# Patient Record
Sex: Female | Born: 1980
Health system: Southern US, Community
[De-identification: ages and names within clinical notes are randomized; demographics above are authoritative.]

## PROBLEM LIST (undated history)

## (undated) DIAGNOSIS — K760 Fatty (change of) liver, not elsewhere classified: Secondary | ICD-10-CM

## (undated) DIAGNOSIS — K219 Gastro-esophageal reflux disease without esophagitis: Secondary | ICD-10-CM

## (undated) DIAGNOSIS — E041 Nontoxic single thyroid nodule: Secondary | ICD-10-CM

## (undated) DIAGNOSIS — E0590 Thyrotoxicosis, unspecified without thyrotoxic crisis or storm: Secondary | ICD-10-CM

## (undated) DIAGNOSIS — I10 Essential (primary) hypertension: Secondary | ICD-10-CM

## (undated) DIAGNOSIS — R7309 Other abnormal glucose: Secondary | ICD-10-CM

## (undated) DIAGNOSIS — E119 Type 2 diabetes mellitus without complications: Secondary | ICD-10-CM

## (undated) DIAGNOSIS — D259 Leiomyoma of uterus, unspecified: Secondary | ICD-10-CM

## (undated) DIAGNOSIS — R945 Abnormal results of liver function studies: Secondary | ICD-10-CM

## (undated) DIAGNOSIS — N939 Abnormal uterine and vaginal bleeding, unspecified: Secondary | ICD-10-CM

## (undated) DIAGNOSIS — D649 Anemia, unspecified: Secondary | ICD-10-CM

## (undated) DIAGNOSIS — E210 Primary hyperparathyroidism: Secondary | ICD-10-CM

## (undated) HISTORY — DX: Essential (primary) hypertension: I10

## (undated) HISTORY — PX: NO PAST SURGERIES: SHX2092

## (undated) HISTORY — DX: Type 2 diabetes mellitus without complications: E11.9

## (undated) HISTORY — DX: Abnormal results of liver function studies: R94.5

## (undated) HISTORY — DX: Abnormal uterine and vaginal bleeding, unspecified: N93.9

## (undated) HISTORY — DX: Anemia, unspecified: D64.9

## (undated) HISTORY — DX: Other abnormal glucose: R73.09

## (undated) HISTORY — PX: ABDOMINAL HYSTERECTOMY: SHX81

---

## 1998-07-04 ENCOUNTER — Ambulatory Visit (HOSPITAL_COMMUNITY): Admission: RE | Admit: 1998-07-04 | Discharge: 1998-07-04 | Payer: Self-pay | Admitting: Internal Medicine

## 2001-05-17 ENCOUNTER — Other Ambulatory Visit: Admission: RE | Admit: 2001-05-17 | Discharge: 2001-05-17 | Payer: Self-pay | Admitting: Obstetrics and Gynecology

## 2001-11-01 ENCOUNTER — Ambulatory Visit (HOSPITAL_COMMUNITY): Admission: RE | Admit: 2001-11-01 | Discharge: 2001-11-01 | Payer: Self-pay | Admitting: Obstetrics and Gynecology

## 2001-11-09 ENCOUNTER — Encounter: Admission: RE | Admit: 2001-11-09 | Discharge: 2001-11-09 | Payer: Self-pay | Admitting: Obstetrics & Gynecology

## 2002-01-06 ENCOUNTER — Ambulatory Visit (HOSPITAL_COMMUNITY): Admission: RE | Admit: 2002-01-06 | Discharge: 2002-01-06 | Payer: Self-pay | Admitting: Obstetrics and Gynecology

## 2002-01-06 ENCOUNTER — Encounter: Payer: Self-pay | Admitting: Obstetrics and Gynecology

## 2002-01-09 ENCOUNTER — Inpatient Hospital Stay (HOSPITAL_COMMUNITY): Admission: AD | Admit: 2002-01-09 | Discharge: 2002-01-12 | Payer: Self-pay | Admitting: Obstetrics and Gynecology

## 2003-09-15 ENCOUNTER — Other Ambulatory Visit: Admission: RE | Admit: 2003-09-15 | Discharge: 2003-09-15 | Payer: Self-pay | Admitting: Obstetrics and Gynecology

## 2004-04-02 ENCOUNTER — Other Ambulatory Visit: Admission: RE | Admit: 2004-04-02 | Discharge: 2004-04-02 | Payer: Self-pay | Admitting: Obstetrics and Gynecology

## 2004-11-07 ENCOUNTER — Other Ambulatory Visit: Admission: RE | Admit: 2004-11-07 | Discharge: 2004-11-07 | Payer: Self-pay | Admitting: Obstetrics and Gynecology

## 2005-02-22 ENCOUNTER — Emergency Department (HOSPITAL_COMMUNITY): Admission: EM | Admit: 2005-02-22 | Discharge: 2005-02-22 | Payer: Self-pay | Admitting: Family Medicine

## 2005-11-26 ENCOUNTER — Other Ambulatory Visit: Admission: RE | Admit: 2005-11-26 | Discharge: 2005-11-26 | Payer: Self-pay | Admitting: Obstetrics and Gynecology

## 2006-11-03 ENCOUNTER — Emergency Department (HOSPITAL_COMMUNITY): Admission: EM | Admit: 2006-11-03 | Discharge: 2006-11-03 | Payer: Self-pay | Admitting: Family Medicine

## 2007-11-24 ENCOUNTER — Emergency Department (HOSPITAL_COMMUNITY): Admission: EM | Admit: 2007-11-24 | Discharge: 2007-11-24 | Payer: Self-pay | Admitting: Emergency Medicine

## 2011-06-30 LAB — POCT RAPID STREP A: Streptococcus, Group A Screen (Direct): POSITIVE — AB

## 2015-12-28 MED FILL — LOSARTAN POTASSIUM 50 MG TA: 50 | 90 days supply | Qty: 45 | Fill #0

## 2015-12-28 MED FILL — AMLODIPINE BESYLATE 10 MG T: 10 | 90 days supply | Qty: 90 | Fill #0

## 2016-05-02 MED FILL — LOSARTAN POTASSIUM 50 MG TA: 50 | 30 days supply | Qty: 15 | Fill #0

## 2016-05-02 MED FILL — AMLODIPINE BESYLATE 10 MG T: 10 | 90 days supply | Qty: 90 | Fill #1

## 2016-05-28 ENCOUNTER — Ambulatory Visit (INDEPENDENT_AMBULATORY_CARE_PROVIDER_SITE_OTHER): Payer: 59 | Admitting: Obstetrics and Gynecology

## 2016-05-28 ENCOUNTER — Encounter: Payer: Self-pay | Admitting: Obstetrics and Gynecology

## 2016-05-28 ENCOUNTER — Other Ambulatory Visit: Payer: Self-pay | Admitting: Obstetrics and Gynecology

## 2016-05-28 VITALS — BP 130/78 | HR 90 | Resp 16 | Ht 65.5 in | Wt 206.6 lb

## 2016-05-28 DIAGNOSIS — D259 Leiomyoma of uterus, unspecified: Secondary | ICD-10-CM

## 2016-05-28 DIAGNOSIS — R5383 Other fatigue: Secondary | ICD-10-CM

## 2016-05-28 DIAGNOSIS — R7989 Other specified abnormal findings of blood chemistry: Secondary | ICD-10-CM

## 2016-05-28 HISTORY — DX: Other specified abnormal findings of blood chemistry: R79.89

## 2016-05-28 LAB — CBC
HCT: 31.6 % — ABNORMAL LOW (ref 35.0–45.0)
Hemoglobin: 9.7 g/dL — ABNORMAL LOW (ref 11.7–15.5)
MCH: 24.5 pg — ABNORMAL LOW (ref 27.0–33.0)
MCHC: 30.7 g/dL — ABNORMAL LOW (ref 32.0–36.0)
MCV: 79.8 fL — ABNORMAL LOW (ref 80.0–100.0)
MPV: 9.8 fL (ref 7.5–12.5)
Platelets: 462 10*3/uL — ABNORMAL HIGH (ref 140–400)
RBC: 3.96 MIL/uL (ref 3.80–5.10)
RDW: 16.5 % — ABNORMAL HIGH (ref 11.0–15.0)
WBC: 10.2 10*3/uL (ref 3.8–10.8)

## 2016-05-28 LAB — COMPREHENSIVE METABOLIC PANEL
ALT: 53 U/L — ABNORMAL HIGH (ref 6–29)
AST: 33 U/L — ABNORMAL HIGH (ref 10–30)
Albumin: 4.4 g/dL (ref 3.6–5.1)
Alkaline Phosphatase: 67 U/L (ref 33–115)
BUN: 9 mg/dL (ref 7–25)
CO2: 24 mmol/L (ref 20–31)
Calcium: 10.6 mg/dL — ABNORMAL HIGH (ref 8.6–10.2)
Chloride: 103 mmol/L (ref 98–110)
Creat: 0.77 mg/dL (ref 0.50–1.10)
Glucose, Bld: 138 mg/dL — ABNORMAL HIGH (ref 65–99)
Potassium: 3.8 mmol/L (ref 3.5–5.3)
Sodium: 136 mmol/L (ref 135–146)
Total Bilirubin: 0.3 mg/dL (ref 0.2–1.2)
Total Protein: 8 g/dL (ref 6.1–8.1)

## 2016-05-28 LAB — TSH: TSH: 1.04 mIU/L

## 2016-05-28 NOTE — Progress Notes (Signed)
GYNECOLOGY  VISIT   HPI: 35 y.o.   Single  African American  female   [id number] with Patient's last menstrual period was 05/18/2016 (exact date).   here for second opinion regarding fibroids of uterus.   Diagnosed with fibroids one year ago. Symptoms are: heavy bleeding and using overnight pads.  Goes through a pack and a half of pads during the first couple of days of menstruation.  Unable to leave the house due to bleeding.  Lots of cramping.  Voiding often.  Feels heaviness and abdomen protrusion.  No back pain.  Feels really bad during her cycle.  Evaluation at [facility] - Dr. Rogue Bussing. Pelvic ultrasound 08/13/15  multifibroid uterus with 7 fibroids.  Largest right lateral - 8.5 cm.  2 submucosal fibroids measuring 4.3 and 3.2 cm.  7 fibroids total - 2.9 - 7.4 cm.  EMS 21.7 mm.  Obscured by fibroids. Left ovary 2.5 cm simple cyst. Right ovary not seen.   EMB 08/13/15 - polypoid degenerating endometrium.   Treatment has included: Lupron was planned but not taken due to cost.  Taking iron for anemia.   Considering future pregnancies but not sure.  Thinks she probably would not have more children.  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   GYNECOLOGIC HISTORY: Patient's last menstrual period was 05/18/2016 (exact date). Contraception:  Condoms most of the time Menopausal hormone therapy:  n/a Last mammogram:  n/a Last pap smear:   05-15-14 Neg:Neg HR HPV        OB History    Gravida Para Term Preterm AB Living   1 1 1     1    SAB TAB Ectopic Multiple Live Births                     There are no active problems to display for this patient.   Past Medical History:  Diagnosis Date  . Abnormal uterine bleeding   . Anemia   . Fibroid   . Hypertension     No past surgical history on file.  Current Outpatient Prescriptions  Medication Sig Dispense Refill  . amLODipine (NORVASC) 10 MG tablet Take 1 tablet by mouth daily.  1  . losartan (COZAAR) 50 MG tablet  Take 50 mg by mouth daily. Patient takes 1/2 tablet daily  0   No current facility-administered medications for this visit.      ALLERGIES: Review of patient's allergies indicates not on file.  Family History  Problem Relation Age of Onset  . Stroke Mother   . Hypertension Mother   . Breast cancer Sister 45    A & W  . Diabetes Paternal Grandmother     Social History   Social History  . Marital status: Single    Spouse name: N/A  . Number of children: N/A  . Years of education: N/A   Occupational History  . Not on file.   Social History Main Topics  . Smoking status: Never Smoker  . Smokeless tobacco: Never Used  . Alcohol use 1.2 oz/week    2 Standard drinks or equivalent per week  . Drug use: No  . Sexual activity: Yes    Partners: Male    Birth control/ protection: Condom     Comment: condoms most of the time   Other Topics Concern  . Not on file   Social History Narrative  . No narrative on file    ROS:  Pertinent items are noted in HPI.  PHYSICAL EXAMINATION:    BP 130/78 (BP Location: Right Arm, Patient Position: Sitting, Cuff Size: Large)   Pulse 90   Resp 16   Ht 5' 5.5" (1.664 m)   Wt 206 lb 9.6 oz (93.7 kg)   LMP 05/18/2016 (Exact Date)   BMI 33.86 kg/m     General appearance: alert, cooperative and appears stated age Head: Normocephalic, without obvious abnormality, atraumatic Neck: no adenopathy, supple, symmetrical, trachea midline and thyroid normal to inspection and palpation Lungs: clear to auscultation bilaterally Heart: regular rate and rhythm Abdomen: 22 week size uterus. soft, non-tender, no masses,  no organomegaly Extremities: extremities normal, atraumatic, no cyanosis or edema Skin: Skin color, texture, turgor normal. No rashes or lesions Lymph nodes: Cervical, supraclavicular, and axillary nodes normal. No abnormal inguinal nodes palpated Neurologic: Grossly normal  Pelvic: External genitalia:  no lesions               Urethra:  normal appearing urethra with no masses, tenderness or lesions              Bartholins and Skenes: normal                 Vagina: normal appearing vagina with normal color and discharge, no lesions              Cervix: no lesions                Bimanual Exam:  Uterus:  22 week size.  Large right lateral fibroid.  Large LUS fibroid pushing into the vagina.  Cervix is very anterior.  Uterus fills pelvis from side to side.  Limited mobility.               Adnexa: no mass, fullness, tenderness          Chaperone was present for exam.  ASSESSMENT  Large uterine fibroids including submucous fibroids. Fatigue.   HTN.  PLAN  Fibroids discussed. Treatment options discussed including Depo Provera, Depo Lupron, uterine artery embolization, myomectomy, and hysterectomy.  Wishes to pursue Depo Lupron.  Will check financial assistance program for injection(s).  I would plan on minimum of 3 months and a maximum of 6 months.  Side effects and add back therapy discussed.  Check CBC, CMP, TSH today.  ACOG handout on fibroids.  Will need a recheck appointment at the end of Depo Lupron treatment.   _____45__ minutes face to face time of which over 50% was spent in counseling.      An After Visit Summary was printed and given to the patient.

## 2016-05-30 ENCOUNTER — Other Ambulatory Visit: Payer: Self-pay | Admitting: Obstetrics and Gynecology

## 2016-05-30 ENCOUNTER — Encounter: Payer: Self-pay | Admitting: Obstetrics and Gynecology

## 2016-05-30 DIAGNOSIS — R945 Abnormal results of liver function studies: Secondary | ICD-10-CM

## 2016-05-30 DIAGNOSIS — R7989 Other specified abnormal findings of blood chemistry: Secondary | ICD-10-CM

## 2016-05-30 DIAGNOSIS — D649 Anemia, unspecified: Secondary | ICD-10-CM

## 2016-05-30 LAB — HEMOGLOBIN A1C
Hgb A1c MFr Bld: 5.7 % — ABNORMAL HIGH (ref <5.7)
Mean Plasma Glucose: 117 mg/dL

## 2016-06-05 ENCOUNTER — Telehealth: Payer: Self-pay | Admitting: *Deleted

## 2016-06-05 MED FILL — LOSARTAN POTASSIUM 50 MG TA: 50 | 30 days supply | Qty: 15 | Fill #0

## 2016-06-05 NOTE — Telephone Encounter (Signed)
Call to patient to follow-up on Lupron. Left message to call back.

## 2016-06-06 ENCOUNTER — Telehealth: Payer: Self-pay | Admitting: *Deleted

## 2016-06-06 ENCOUNTER — Encounter: Payer: Self-pay | Admitting: Obstetrics and Gynecology

## 2016-06-06 NOTE — Telephone Encounter (Signed)
Call to patient. Advised I spoke to [employer] yesterday (see phone call attempt from 06-05-16) and patient qualifies for savings card to reduce this amount. [employer] tried to contact her but was unable to reach her. Phone number provided to patient for her to call. Discussed that once she receives savings card from Lupron and works out payment with speciality pharmacy, medication will be shipped here and given with her menstrual cycle.  Patient to call back with menses or if any difficulty getting savings card.  Routing to provider for final review. Patient agreeable to disposition. Will close encounter.    Routed to Dr Talbert Nan covering for Dr Quincy Simmonds.

## 2016-06-06 NOTE — Telephone Encounter (Signed)
My Chart message from patient:  Hi Dr.Silva. I received a phone call today from [employer] about the Lupron injection. When speaking to them they advised me that the copay for the injection would be $280!! I thought that when we spoke there was some type of savings or assistance that I could get for the prescription. If there is would you provide me with the names and phone numbers. The cost of that prescription is way out of my price range and I know I wouldn't be able to pay that upfront.               Thanks

## 2016-06-12 NOTE — Telephone Encounter (Signed)
See next encounter on 06-06-16.  Routing to provider for final review. Will close encounter.

## 2016-06-13 ENCOUNTER — Telehealth: Payer: Self-pay | Admitting: Obstetrics and Gynecology

## 2016-06-13 MED ORDER — LEUPROLIDE ACETATE 3.75 MG IM KIT: 3.7500 mg | PACK | INTRAMUSCULAR | 5 refills | Status: DC

## 2016-06-13 MED FILL — LUPRON DEPOT 3.75 MG KIT: 3.75 | 30 days supply | Qty: 1 | Fill #0

## 2016-06-13 NOTE — Telephone Encounter (Signed)
Per [facility] pharmacy. Can use the [facility] out-patient pharmacy location of choice. Will have to be ordered but should arrive Monday if ordered today. Patient notified, RX sent to [facility]. She will call them with savings card information. Menses is due Saturday so will aim to proceed with first injection next week.  Routing to provider for final review. Patient agreeable to disposition. Will close encounter.

## 2016-06-13 NOTE — Telephone Encounter (Signed)
Call to patient. She received co-pay card and was ready for medication to be sent through [facility]. Has now been told medication must come through [facility] since she is [employer]. Advised will call [facility] and call her back.

## 2016-06-13 NOTE — Telephone Encounter (Signed)
Patient is calling regarding her prescription. Patient was told by the insurance company that the prescription needs to be sent to [facility] pharmacy. Patient was told the prescription was to be delivered to our office. See previous telephone from [place].

## 2016-06-16 NOTE — Progress Notes (Signed)
Patient here for her first Lupron Injection.  UPT is negative  Patient tolerated well. Injection given in   RIght Glute.   Routed to provider and encounter closed.

## 2016-06-17 ENCOUNTER — Ambulatory Visit (INDEPENDENT_AMBULATORY_CARE_PROVIDER_SITE_OTHER): Payer: 59

## 2016-06-17 VITALS — BP 126/74 | HR 116 | Resp 16 | Ht 65.5 in | Wt 206.0 lb

## 2016-06-17 DIAGNOSIS — D259 Leiomyoma of uterus, unspecified: Secondary | ICD-10-CM | POA: Diagnosis not present

## 2016-06-17 MED ORDER — LEUPROLIDE ACETATE 3.75 MG IM KIT
3.7500 mg | PACK | Freq: Once | INTRAMUSCULAR | Status: AC
  Administered 2016-06-17: 3.75 mg via INTRAMUSCULAR

## 2016-06-26 ENCOUNTER — Encounter: Payer: Self-pay | Admitting: Obstetrics and Gynecology

## 2016-07-23 ENCOUNTER — Telehealth: Payer: Self-pay | Admitting: Obstetrics and Gynecology

## 2016-07-23 DIAGNOSIS — D259 Leiomyoma of uterus, unspecified: Secondary | ICD-10-CM | POA: Diagnosis not present

## 2016-07-23 DIAGNOSIS — Z0000 Encounter for general adult medical examination without abnormal findings: Secondary | ICD-10-CM | POA: Diagnosis not present

## 2016-07-23 DIAGNOSIS — Z862 Personal history of diseases of the blood and blood-forming organs and certain disorders involving the immune mechanism: Secondary | ICD-10-CM | POA: Diagnosis not present

## 2016-07-23 DIAGNOSIS — I10 Essential (primary) hypertension: Secondary | ICD-10-CM | POA: Diagnosis not present

## 2016-07-23 MED FILL — LOSARTAN POTASSIUM 50 MG TA: 50 | 90 days supply | Qty: 45 | Fill #0

## 2016-07-23 MED FILL — LUPRON DEPOT 3.75 MG KIT: 3.75 | 30 days supply | Qty: 1 | Fill #1

## 2016-07-23 NOTE — Telephone Encounter (Signed)
Spoke with patient. Patient calling to request scheduling for Lupron injection. Patient scheduled for 07/24/16 at 9:30am. Patient states she will pick up medication from pharmacy today. Patient is agreeable to date and time.    Routing to provider for final review. Patient is agreeable to disposition. Will close encounter.

## 2016-07-23 NOTE — Telephone Encounter (Signed)
Please see myChart note.  Also patient called and said she needs to schedule Lupron injection for Thursday or Friday.

## 2016-07-24 ENCOUNTER — Ambulatory Visit (INDEPENDENT_AMBULATORY_CARE_PROVIDER_SITE_OTHER): Payer: 59

## 2016-07-24 ENCOUNTER — Ambulatory Visit: Payer: Self-pay

## 2016-07-24 VITALS — BP 116/68 | HR 96 | Resp 18 | Wt 204.0 lb

## 2016-07-24 DIAGNOSIS — D259 Leiomyoma of uterus, unspecified: Secondary | ICD-10-CM

## 2016-07-24 MED ORDER — LEUPROLIDE ACETATE 3.75 MG IM KIT
3.7500 mg | PACK | Freq: Once | INTRAMUSCULAR | Status: AC
  Administered 2016-07-24: 3.75 mg via INTRAMUSCULAR

## 2016-07-24 NOTE — Progress Notes (Signed)
35 y.o. Single African American female here for her second Depo Lupron injection.  Indication for injection:  Uterine leiomyoma, unspecified location  LMP:  06/14/16  UPT: Negative  Lupron order:  3.75mg  IM in Left Glute.    Order to administer given by Dr. Quincy Simmonds  Injection was tolerated well.

## 2016-07-25 ENCOUNTER — Encounter: Payer: Self-pay | Admitting: Obstetrics and Gynecology

## 2016-07-30 ENCOUNTER — Encounter: Payer: Self-pay | Admitting: Obstetrics and Gynecology

## 2016-08-22 MED FILL — LUPRON DEPOT 3.75 MG KIT: 3.75 | 30 days supply | Qty: 1 | Fill #2

## 2016-08-25 ENCOUNTER — Telehealth: Payer: Self-pay | Admitting: Obstetrics and Gynecology

## 2016-08-25 NOTE — Telephone Encounter (Signed)
Patient states that she needs to schedule her Lupron injection for tomorrow.

## 2016-08-25 NOTE — Telephone Encounter (Signed)
Spoke with patient. Patient calling to schedule appt for Lupron inj. Last injection 07/24/16. Patient to pick up med from pharmacy today and bring for injection. Patient scheduled for 11/21 at 0930. Patient verbalizes understanding and is agreeable.  Routing to provider for final review. Patient is agreeable to disposition. Will close encounter.  Cc: Dr. Quincy Simmonds

## 2016-08-26 ENCOUNTER — Ambulatory Visit (INDEPENDENT_AMBULATORY_CARE_PROVIDER_SITE_OTHER): Payer: 59

## 2016-08-26 VITALS — BP 112/60 | HR 68 | Resp 16 | Ht 65.5 in | Wt 205.0 lb

## 2016-08-26 DIAGNOSIS — D259 Leiomyoma of uterus, unspecified: Secondary | ICD-10-CM | POA: Diagnosis not present

## 2016-08-26 MED ORDER — LEUPROLIDE ACETATE 3.75 MG IM KIT
3.7500 mg | PACK | Freq: Once | INTRAMUSCULAR | Status: AC
  Administered 2016-08-26: 3.75 mg via INTRAMUSCULAR

## 2016-08-26 NOTE — Progress Notes (Signed)
35 y.o. Single African American female here for her third Depo Lupron injection.  Indication for injection:  Uterine Leiomyoma  LMP:  06/14/16  UPT: Negative  Lupron order:  3.75mg mg IM in R Glute.    Order to administer given by Dr. Quincy Simmonds.  Injection was tolerated well.

## 2016-09-04 ENCOUNTER — Encounter: Payer: Self-pay | Admitting: Obstetrics and Gynecology

## 2016-09-04 ENCOUNTER — Telehealth: Payer: Self-pay

## 2016-09-04 NOTE — Telephone Encounter (Signed)
Telephone encounter created to review with Dr.Silva. 

## 2016-09-04 NOTE — Telephone Encounter (Signed)
Non-Urgent Medical Question  Message [id number]  From [place], MD Sent 09/04/2016 11:40 AM  Hi Dr.Silva,   I received my third Lupron injection on the 21st. During our conversation about the treatment you recommended that I take it for three months. I noticed the prescription is for 6 months. What's the next step after this injection wears off?                    Thanks   Responsible Party   Pool - [facility] Clinical Pool No one has taken responsibility for this message.  No actions have been taken on this message.   Routing to [place] for review and advise.

## 2016-09-05 ENCOUNTER — Telehealth: Payer: Self-pay | Admitting: Obstetrics and Gynecology

## 2016-09-05 DIAGNOSIS — D259 Leiomyoma of uterus, unspecified: Secondary | ICD-10-CM

## 2016-09-05 MED FILL — AMLODIPINE BESYLATE 10 MG T: 10 | 90 days supply | Qty: 90 | Fill #0

## 2016-09-05 NOTE — Telephone Encounter (Signed)
Spoke with patient. Patient returning call for PUS scheduling (see also, telephone encounter dated 09/04/16). Patient scheduled for 09/18/16 at 0800, 0830 appt to follow with Dr. Quincy Simmonds. Patient verbalizes understanding and is agreeable to date and time.  Dr. Quincy Simmonds, can you clarify PUS diagnosis? And I will place order.   Cc: Lerry Liner; [facility]

## 2016-09-05 NOTE — Telephone Encounter (Signed)
Ultrasound and examination sounds great! Please put in an order for pelvic ultrasound.

## 2016-09-05 NOTE — Telephone Encounter (Signed)
Reason for ultrasound is fibroids.

## 2016-09-05 NOTE — Telephone Encounter (Signed)
Spoke with patient. Advised of message as seen below from [place]. Patient is unable to schedule at this time as she does not have her calendar. Will return call to the office to schedule PUS appointment.  Routing to provider for final review. Patient agreeable to disposition. Will close encounter.

## 2016-09-05 NOTE — Telephone Encounter (Signed)
Patient is returning a call to [place]. Patient is ready to schedule her ultrasound.

## 2016-09-05 NOTE — Telephone Encounter (Signed)
Spoke with patient. Advised of message as seen below from [place]. Patient became frustrated stating "When Dr.Silva and I spoke before she mentioned coming in after 3 months for an ultrasound to see if it was working and if I needed to continue. Coming in to talk about what we have already talked about makes no sense."  Advised I can review with Dr.Silva and return call. Patient is agreeable.

## 2016-09-05 NOTE — Telephone Encounter (Signed)
I would like to have the patient return to the office before 12/21 to re-evaluate her clinical picture and create goals with her.  Her next Depo Lupron would be due on 12/21 if we are going to continue with this therapy for 6 months.

## 2016-09-08 NOTE — Telephone Encounter (Signed)
Order placed.  Routing to provider for final review. Patient is agreeable to disposition. Will close encounter.

## 2016-09-18 ENCOUNTER — Ambulatory Visit (INDEPENDENT_AMBULATORY_CARE_PROVIDER_SITE_OTHER): Payer: 59 | Admitting: Obstetrics and Gynecology

## 2016-09-18 ENCOUNTER — Encounter: Payer: Self-pay | Admitting: Obstetrics and Gynecology

## 2016-09-18 ENCOUNTER — Ambulatory Visit (INDEPENDENT_AMBULATORY_CARE_PROVIDER_SITE_OTHER): Payer: 59

## 2016-09-18 ENCOUNTER — Other Ambulatory Visit: Payer: Self-pay | Admitting: Obstetrics and Gynecology

## 2016-09-18 VITALS — BP 110/74 | HR 70 | Resp 16 | Ht 65.5 in | Wt 204.0 lb

## 2016-09-18 DIAGNOSIS — R7989 Other specified abnormal findings of blood chemistry: Secondary | ICD-10-CM

## 2016-09-18 DIAGNOSIS — R945 Abnormal results of liver function studies: Secondary | ICD-10-CM

## 2016-09-18 DIAGNOSIS — D649 Anemia, unspecified: Secondary | ICD-10-CM

## 2016-09-18 DIAGNOSIS — D259 Leiomyoma of uterus, unspecified: Secondary | ICD-10-CM

## 2016-09-18 LAB — HEPATIC FUNCTION PANEL
ALBUMIN: 4.8 g/dL (ref 3.6–5.1)
ALT: 116 U/L — AB (ref 6–29)
AST: 78 U/L — ABNORMAL HIGH (ref 10–30)
Alkaline Phosphatase: 85 U/L (ref 33–115)
BILIRUBIN INDIRECT: 0.7 mg/dL (ref 0.2–1.2)
Bilirubin, Direct: 0.1 mg/dL (ref <=0.2)
TOTAL PROTEIN: 8.8 g/dL — AB (ref 6.1–8.1)
Total Bilirubin: 0.8 mg/dL (ref 0.2–1.2)

## 2016-09-18 LAB — CBC
HEMATOCRIT: 43.7 % (ref 35.0–45.0)
Hemoglobin: 14.3 g/dL (ref 11.7–15.5)
MCH: 27.1 pg (ref 27.0–33.0)
MCHC: 32.7 g/dL (ref 32.0–36.0)
MCV: 82.9 fL (ref 80.0–100.0)
MPV: 9.9 fL (ref 7.5–12.5)
PLATELETS: 345 10*3/uL (ref 140–400)
RBC: 5.27 MIL/uL — ABNORMAL HIGH (ref 3.80–5.10)
RDW: 16.9 % — AB (ref 11.0–15.0)
WBC: 8.4 10*3/uL (ref 3.8–10.8)

## 2016-09-18 NOTE — Progress Notes (Signed)
GYNECOLOGY  VISIT   HPI: 35 y.o.   Single  African American  female   [id number] with No LMP recorded. Patient has had an injection.   here for PUS to recheck fibroids.  Seen 05/28/16 and had 22 week size uterus filling the pelvis.  Hgb 9.7 on 05/28/16. Had follow up Hgb with [facility] and it was normal.   Received Depo Lupron x 3.  Some hot flashes.  Declines future childbearing.  Considering hysterectomy.   On 05/28/16, had elevated hemoglobin A1C to 5.7, calcium level 10.6, and SGOT 33, SGPT 53, and no known follow up to date.  GYNECOLOGIC HISTORY: No LMP recorded. Patient has had an injection. Contraception:  Condoms most of the time, pt on lupron Menopausal hormone therapy:  None Last mammogram:  none Last pap smear:   05-15-14 neg        OB History    Gravida Para Term Preterm AB Living   1 1 1     1    SAB TAB Ectopic Multiple Live Births                     There are no active problems to display for this patient.   Past Medical History:  Diagnosis Date  . Abnormal uterine bleeding   . Anemia   . Elevated liver function tests   . Fibroid   . Hypertension   . Serum calcium elevated     No past surgical history on file.  Current Outpatient Prescriptions  Medication Sig Dispense Refill  . amLODipine (NORVASC) 10 MG tablet Take 1 tablet by mouth daily.  1  . leuprolide (LUPRON DEPOT, 20-MONTH,) 3.75 MG injection Inject 3.75 mg into the muscle every 30 (thirty) days. 1 each 5  . losartan (COZAAR) 50 MG tablet Take 50 mg by mouth daily. Patient takes 1/2 tablet daily  0   No current facility-administered medications for this visit.      ALLERGIES: Patient has no allergy information on record.  Family History  Problem Relation Age of Onset  . Stroke Mother   . Hypertension Mother   . Breast cancer Sister 30    A & W  . Diabetes Paternal Grandmother     Social History   Social History  . Marital status: Single    Spouse name: N/A  . Number of children:  N/A  . Years of education: N/A   Occupational History  . Not on file.   Social History Main Topics  . Smoking status: Never Smoker  . Smokeless tobacco: Never Used  . Alcohol use 1.2 oz/week    2 Standard drinks or equivalent per week  . Drug use: No  . Sexual activity: Yes    Partners: Male    Birth control/ protection: Condom     Comment: condoms most of the time   Other Topics Concern  . Not on file   Social History Narrative  . No narrative on file    ROS:  Pertinent items are noted in HPI.  PHYSICAL EXAMINATION:    BP 110/74   Pulse 70   Resp 16   Ht 5' 5.5" (1.664 m)   Wt 204 lb (92.5 kg)   BMI 33.43 kg/m     General appearance: alert, cooperative and appears stated age   Abdomen: 19 week size uterus, soft, non-tender.  Pelvic ultrasound: 8 fibroids - 2.2 cm - 6.1 cm. EMS obscured by central fibroids.  Normal ovaries.  No free  fluid.   ASSESSMENT   Fibroid uterus.  Status post Depo Lupron x 3.  Recent anemia.  Elevated calcium and LFTs.  HgbA1C 5.7.  PLAN  Discussed hysterectomy, uterine artery embolization, medical therapy for treatment of fibroids.  Patient will continue Depo Lupron x 3 more months.  Plan for laparoscopic hysterectomy/bilateral salpingectomy/cystoscopy in March.  Check CBC, CMP, PTH today.  ACOG handout on hysterectomy to patient.    An After Visit Summary was printed and given to the patient.  __25____ minutes face to face time of which over 50% was spent in counseling.

## 2016-09-19 LAB — PTH, INTACT AND CALCIUM
Calcium: 10.7 mg/dL — ABNORMAL HIGH (ref 8.6–10.2)
PTH: 108 pg/mL — AB (ref 14–64)

## 2016-09-21 ENCOUNTER — Encounter: Payer: Self-pay | Admitting: Obstetrics and Gynecology

## 2016-09-22 ENCOUNTER — Telehealth: Payer: Self-pay | Admitting: *Deleted

## 2016-09-22 DIAGNOSIS — E349 Endocrine disorder, unspecified: Secondary | ICD-10-CM

## 2016-09-22 NOTE — Telephone Encounter (Signed)
-----   Message from Nunzio Cobbs, MD sent at 09/21/2016  8:08 PM EST ----- Results to patient through My Chart. Please contact the patient to have a follow up appointment with endocrinology and her PCP.   Hello [name] [name],   I am sharing your blood work with you which shows an elevated calcium and parathyroid hormone level.   You may be developing hyperparathyroidism.   I would like for you to see an endocrinologist for further evaluation and treatment of this.   Your liver function studies increased further, and you will also need follow up of this as well by your primary care provider.   Your hemoglobin is now normal.   I will have the nurse contact you in follow up to this note, so we can help to coordinate your care.   Thanks,   [name] Half, MD

## 2016-09-22 NOTE — Telephone Encounter (Signed)
Left message to call Ainslie Mazurek at [phone].  

## 2016-09-24 ENCOUNTER — Encounter: Payer: Self-pay | Admitting: Obstetrics and Gynecology

## 2016-09-24 NOTE — Telephone Encounter (Signed)
Keep this phone message open and call patient back in one week if she has not returned the call.  I would like for [place] to know when she will be seen.

## 2016-09-24 NOTE — Telephone Encounter (Signed)
Patient returning call.

## 2016-09-24 NOTE — Telephone Encounter (Addendum)
Spoke with patient. Patient states she has reviewed MyChart message as seen below per Dr. Quincy Simmonds. Advised patient referral placed to [facility] Endocrinology, Dr. Loanne Drilling- will receive call from our referral department for scheduling. Advised, RN will call and schedule f/u appt with pcp, asked patient for availability for scheduling, patient declined. Patient states she would like to call and schedule that appointment. Advised patient to return call to office with appointment details. Patient verbalizes understanding and is agreeable.

## 2016-09-24 NOTE — Telephone Encounter (Addendum)
Left message to call Sharee Pimple at [phone].  Order placed for referral to [facility] Endocrinology -Dr. Renato Shin.  Cc: Theresia Lo

## 2016-09-30 MED FILL — LUPRON DEPOT 3.75 MG KIT: 3.75 | 30 days supply | Qty: 1 | Fill #3

## 2016-10-01 NOTE — Progress Notes (Signed)
35 y.o. Single African American female here for her fourth Depo Lupron injection.  Indication for injection:  Uterine Leiomyoma  LMP:  Not since starting Lupron Injection  UPT: Neg  Contraception:  Condoms  Lupron order:  3.75mg  IM in L Glute.  Patient tolerated well.   Order to administer given by Dr. Quincy Simmonds.

## 2016-10-01 NOTE — Telephone Encounter (Signed)
Left detailed message, ok per current dpr. Advised patient was returning call to f/u with pcp scheduling. Advised to return call to [place] at [phone].

## 2016-10-02 ENCOUNTER — Ambulatory Visit (INDEPENDENT_AMBULATORY_CARE_PROVIDER_SITE_OTHER): Payer: 59

## 2016-10-02 VITALS — BP 140/90 | HR 88 | Resp 20 | Ht 65.5 in | Wt 208.6 lb

## 2016-10-02 DIAGNOSIS — Z01812 Encounter for preprocedural laboratory examination: Secondary | ICD-10-CM | POA: Diagnosis not present

## 2016-10-02 DIAGNOSIS — D259 Leiomyoma of uterus, unspecified: Secondary | ICD-10-CM | POA: Diagnosis not present

## 2016-10-02 LAB — POCT URINE PREGNANCY: Preg Test, Ur: NEGATIVE

## 2016-10-02 MED ORDER — LEUPROLIDE ACETATE 3.75 MG IM KIT
3.7500 mg | PACK | Freq: Once | INTRAMUSCULAR | Status: AC
  Administered 2016-10-02: 3.75 mg via INTRAMUSCULAR

## 2016-10-08 NOTE — Telephone Encounter (Signed)
Left message to call [name] [name] at [phone].  

## 2016-10-09 NOTE — Telephone Encounter (Signed)
Dr. Quincy Simmonds, I have attempted to contact patient x2 since initial telephone encounter with no return call, please advise?  Cc: [name] [name]

## 2016-10-13 NOTE — Telephone Encounter (Signed)
Thank you.  OK to close encounter. 

## 2016-10-13 NOTE — Telephone Encounter (Signed)
Dr. Quincy Simmonds, forwarding [place]  Patient is scheduled with Dr. Loanne Drilling 10/28/16 at 0800.

## 2016-10-25 NOTE — Progress Notes (Signed)
Subjective:    Patient ID: [name] [name], female    DOB: [birth year], 36 y.o.   MRN: [id number]  HPI Pt is referred by Dr Quincy Simmonds, for hypercalcemia.  Pt was noted to have mild hypercalcemia in 2017 (no prior is available). she has never had osteoporosis, urolithiasis, thyroid probs, sarcoidosis, cancer, PUD, pancreatitis, recent severe injury, depression, or bony fracture.  she does not take vitamin-D or A supplements now, but she rx'ed a low vit-D in 2017.  Pt has no h/o prolonged immobilization.  Pt denies taking antacids or Li++.  In 2017, HCTZ was changed to losartan.  She has slight pelvic pain (attributed to fibroids), and assoc frequent urination.  Past Medical History:  Diagnosis Date  . Abnormal uterine bleeding   . Anemia   . Elevated liver function tests   . Elevated parathyroid hormone 09/2016  . Fibroid   . Hypertension   . Serum calcium elevated 2017   Elevated PTH.    No past surgical history on file.  Social History   Social History  . Marital status: Single    Spouse name: N/A  . Number of children: N/A  . Years of education: N/A   Occupational History  . Not on file.   Social History Main Topics  . Smoking status: Never Smoker  . Smokeless tobacco: Never Used  . Alcohol use 1.2 oz/week    2 Standard drinks or equivalent per week  . Drug use: No  . Sexual activity: Yes    Partners: Male    Birth control/ protection: Condom     Comment: condoms most of the time   Other Topics Concern  . Not on file   Social History Narrative  . No narrative on file    Current Outpatient Prescriptions on File Prior to Visit  Medication Sig Dispense Refill  . amLODipine (NORVASC) 10 MG tablet Take 1 tablet by mouth daily.  1  . leuprolide (LUPRON DEPOT, 72-MONTH,) 3.75 MG injection Inject 3.75 mg into the muscle every 30 (thirty) days. 1 each 5  . losartan (COZAAR) 50 MG tablet Take 50 mg by mouth daily. Patient takes 1/2 tablet daily  0   No current  facility-administered medications on file prior to visit.     Not on File  Family History  Problem Relation Age of Onset  . Stroke Mother   . Hypertension Mother   . Breast cancer Sister 68    A & W  . Diabetes Paternal Grandmother   . Hypercalcemia Neg Hx     BP 132/80   Pulse (!) 106   Ht 5' 5.5" (1.664 m)   Wt 210 lb (95.3 kg)   SpO2 95%   BMI 34.41 kg/m    Review of Systems denies galactorrhea, hematuria, memory loss, numbness, arthralgias, abdominal pain, muscle weakness, urinary frequency, hypoglycemia, skin rash, visual loss, sob, diarrhea, rhinorrhea, easy bruising, and depression.  She has gained weight    Objective:   Physical Exam VS: see vs page GEN: no distress HEAD: head: no deformity eyes: no periorbital swelling, no proptosis external nose and ears are normal mouth: no lesion seen NECK: 2 cm right thyroid nodule CHEST WALL: no deformity.  No kyphosis.  LUNGS: clear to auscultation CV: reg rate and rhythm, no murmur ABD: abdomen is soft, nontender.  no hepatosplenomegaly.  not distended.  no hernia MUSCULOSKELETAL: muscle bulk and strength are grossly normal.  no obvious joint swelling.  gait is normal and steady EXTEMITIES: no  edema.  PULSES: no carotid bruit.  NEURO:  cn 2-12 grossly intact.   readily moves all 4's.  sensation is intact to touch on all 4's SKIN:  Normal texture and temperature.  No rash or suspicious lesion is visible.   NODES:  None palpable at the neck.  PSYCH: alert, well-oriented.  Does not appear anxious nor depressed.     Lab Results  Component Value Date   CREATININE 0.77 05/28/2016   BUN 9 05/28/2016   NA 136 05/28/2016   K 3.8 05/28/2016   CL 103 05/28/2016   CO2 24 05/28/2016   Lab Results  Component Value Date   TSH 1.04 05/28/2016   25-OH vit-D=17 Lab Results  Component Value Date   PTH 81 (H) 10/28/2016   CALCIUM 10.8 (H) 10/28/2016   PHOS 2.5 10/28/2016   I have reviewed outside records, and  summarized: Pt was noted to have elevated PTH and Ca++, and referred here.  TAH is being considered.       Assessment & Plan:  Vit-D deficiency, new to me. Hyperparathyroidism: she prob has a combination of primary and secondary.  Thyroid nodule, unrelated to the above  I have sent a prescription to your pharmacy, for ergocalciferol. Recheck labs in 1 month. Check thyroid US

## 2016-10-28 ENCOUNTER — Encounter: Payer: Self-pay | Admitting: Endocrinology

## 2016-10-28 ENCOUNTER — Ambulatory Visit (INDEPENDENT_AMBULATORY_CARE_PROVIDER_SITE_OTHER): Payer: 59 | Admitting: Endocrinology

## 2016-10-28 DIAGNOSIS — E041 Nontoxic single thyroid nodule: Secondary | ICD-10-CM | POA: Diagnosis not present

## 2016-10-28 DIAGNOSIS — E210 Primary hyperparathyroidism: Secondary | ICD-10-CM

## 2016-10-28 LAB — VITAMIN D 25 HYDROXY (VIT D DEFICIENCY, FRACTURES): VITD: 16.55 ng/mL — ABNORMAL LOW (ref 30.00–100.00)

## 2016-10-28 LAB — TSH: TSH: 1.42 u[IU]/mL (ref 0.35–4.50)

## 2016-10-28 LAB — PHOSPHORUS: Phosphorus: 2.5 mg/dL (ref 2.3–4.6)

## 2016-10-28 MED ORDER — VITAMIN D (ERGOCALCIFEROL) 1.25 MG (50000 UNIT) PO CAPS: 50000.0000 [IU] | ORAL_CAPSULE | ORAL | 0 refills | Status: AC

## 2016-10-28 MED FILL — VIT D2 1.25 MG (50,000 UNIT: 1.25 MG | 28 days supply | Qty: 12 | Fill #0

## 2016-10-28 NOTE — Patient Instructions (Signed)
blood tests are requested for you today.  We'll let you know about the results. If no other cause is found for the high calcium and parathyroid, you should consider having surgery, so I would request an appointment for you.  Let's check a thyroid ultrasound (separate from the parathyroid problem).  you will receive a phone call, about a day and time for an appointment.

## 2016-10-29 LAB — PTH, INTACT AND CALCIUM
CALCIUM: 10.8 mg/dL — AB (ref 8.6–10.2)
PTH: 81 pg/mL — ABNORMAL HIGH (ref 14–64)

## 2016-10-31 MED FILL — LUPRON DEPOT 3.75 MG KIT: 3.75 | 30 days supply | Qty: 1 | Fill #4

## 2016-11-04 ENCOUNTER — Telehealth: Payer: Self-pay | Admitting: Obstetrics and Gynecology

## 2016-11-04 MED FILL — LOSARTAN POTASSIUM 50 MG TA: 50 | 90 days supply | Qty: 45 | Fill #1

## 2016-11-04 NOTE — Telephone Encounter (Signed)
Patient called requesting to speak with the nurse to schedule her next Lupron injection.

## 2016-11-04 NOTE — Telephone Encounter (Signed)
Spoke with patient. Patient would like to schedule her Lupron injection. Last Lupron was given on 10/02/2016. Appointment scheduled for 11/02/2016. Patient is agreeable to date and time. Patient will pick up Lupron from [place] before coming to her appointment.  Routing to provider for final review. Patient agreeable to disposition. Will close encounter.

## 2016-11-06 ENCOUNTER — Ambulatory Visit (INDEPENDENT_AMBULATORY_CARE_PROVIDER_SITE_OTHER): Payer: 59

## 2016-11-06 VITALS — BP 130/90 | HR 76 | Resp 16 | Ht 65.5 in | Wt 209.2 lb

## 2016-11-06 DIAGNOSIS — D259 Leiomyoma of uterus, unspecified: Secondary | ICD-10-CM | POA: Diagnosis not present

## 2016-11-06 DIAGNOSIS — Z01812 Encounter for preprocedural laboratory examination: Secondary | ICD-10-CM | POA: Diagnosis not present

## 2016-11-06 LAB — POCT URINE PREGNANCY: Preg Test, Ur: NEGATIVE

## 2016-11-06 MED ORDER — LEUPROLIDE ACETATE 3.75 MG IM KIT
3.7500 mg | PACK | Freq: Once | INTRAMUSCULAR | Status: AC
  Administered 2016-11-06: 3.75 mg via INTRAMUSCULAR

## 2016-11-06 NOTE — Progress Notes (Signed)
36 y.o. Single African American female here for her fifth Depo Lupron injection.  Indication for injection:  Uterine Leiomyoma  LMP:  Not since started injection  UPT: Neg  Contraception:  Condoms  Lupron order:  3.75mg  IM x 33-month dosages.    Order to administer given by Dr. Quincy Simmonds.   Patient tolerated well. Injection given in L Glute.   Routed to provider and encounter closed.

## 2016-11-12 ENCOUNTER — Telehealth: Payer: Self-pay | Admitting: Obstetrics and Gynecology

## 2016-11-12 DIAGNOSIS — D259 Leiomyoma of uterus, unspecified: Secondary | ICD-10-CM

## 2016-11-12 NOTE — Telephone Encounter (Signed)
Spoke with patient regarding benefits for recommended surgical procedure. Patient understood information presented. Patient states she is not able to move forward with scheduling procedure until April, at the earliest, due to requiring a Lupron injection in March.  Patient states she will call when she is ready to proceed with scheduling.  Routing to [employer]

## 2016-11-13 NOTE — Telephone Encounter (Signed)
Please schedule pelvic ultrasound and recheck with me for March.

## 2016-11-13 NOTE — Telephone Encounter (Signed)
Lupron injection #6 will be due early March. ( Last injection given on 11-06-16). Last PUS 09-18-16.  Does she need repeat PUS with office visit in mid March?   Please advise.

## 2016-11-26 NOTE — Telephone Encounter (Signed)
Call to patient to schedule follow-up with Dr Quincy Simmonds and pelvic ultrasound. Left message to call back.

## 2016-12-03 MED FILL — LUPRON DEPOT 3.75 MG KIT: 3.75 | 30 days supply | Qty: 1 | Fill #5

## 2016-12-03 NOTE — Telephone Encounter (Signed)
Routing to provider for final review. Patient agreeable to disposition. Will close encounter.     

## 2016-12-03 NOTE — Telephone Encounter (Signed)
Called patient to review benefits for a recommended ultrasound. Left Voicemail requesting a call back. °

## 2016-12-03 NOTE — Telephone Encounter (Signed)
Patient is returning a call to Suzy. °

## 2016-12-03 NOTE — Telephone Encounter (Signed)
Spoke with patient in regards to scheduling appointment for Lupron injection and recommended ultrasound. Patient is scheduled for Lupron injection on 12/08/16. Patient is scheduled for ultrasound on 12/25/16 with Dr Quincy Simmonds. Patient is aware of date, arrival times and cancellation policy. Patient had no further questions.  Routing to Dr Quincy Simmonds for final review  cc: Lamont Snowball

## 2016-12-08 ENCOUNTER — Ambulatory Visit (INDEPENDENT_AMBULATORY_CARE_PROVIDER_SITE_OTHER): Payer: 59

## 2016-12-08 VITALS — BP 126/76 | HR 92 | Resp 16 | Ht 66.0 in | Wt 211.0 lb

## 2016-12-08 DIAGNOSIS — D259 Leiomyoma of uterus, unspecified: Secondary | ICD-10-CM

## 2016-12-08 MED ORDER — LEUPROLIDE ACETATE 3.75 MG IM KIT
3.7500 mg | PACK | Freq: Once | INTRAMUSCULAR | Status: AC
  Administered 2016-12-08: 3.75 mg via INTRAMUSCULAR

## 2016-12-08 NOTE — Progress Notes (Signed)
36 y.o. Single African American female here for her sixth Depo Lupron injection.  Indication for injection:  Uterine Leiomyoma  LMP:  Not since started injection UPT: Neg  Contraception:  Condoms  Lupron order:  3.75mg  IM x 11-month dosages.    Order to administer given by Dr. Quincy Simmonds on  Patient tolerated well. Injection given in R Glute.  Routed to provider and encounter closed.

## 2016-12-25 ENCOUNTER — Ambulatory Visit (INDEPENDENT_AMBULATORY_CARE_PROVIDER_SITE_OTHER): Payer: 59 | Admitting: Obstetrics and Gynecology

## 2016-12-25 ENCOUNTER — Encounter: Payer: Self-pay | Admitting: Obstetrics and Gynecology

## 2016-12-25 ENCOUNTER — Ambulatory Visit (INDEPENDENT_AMBULATORY_CARE_PROVIDER_SITE_OTHER): Payer: 59

## 2016-12-25 VITALS — BP 120/72 | HR 88 | Ht 65.5 in | Wt 207.0 lb

## 2016-12-25 DIAGNOSIS — D259 Leiomyoma of uterus, unspecified: Secondary | ICD-10-CM

## 2016-12-25 NOTE — Progress Notes (Signed)
Patient ID: [name] [name], female   DOB: June 28, 1981, 36 y.o.   MRN: [id number] GYNECOLOGY  VISIT   HPI:120/72 36 y.o.   Single  African American  female   [id number] with No LMP recorded. Patient has had an injection.   here for pelvic ultrasound for fibroid uterus.    Hx 22 week size uterus and anemia from bleeding fibroids. Status post Depo Lupron x 6 months total.  Had re-examination in December and uterus was 19 week size then.  Hgb went from 9.7 in September to 14.3 in December.  Hot flashes are not bad.  No bleeding at all.   Declines childbearing.  Wants hysterectomy.   Has hyperparathyroidism. On vit D. Sees Dr. Loanne Drilling.  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   GYNECOLOGIC HISTORY: No LMP recorded. Patient has had an injection. Contraception:  condoms Menopausal hormone therapy:  n/a Last mammogram:  n/a Last pap smear:   05-15-14 Neg        OB History    Gravida Para Term Preterm AB Living   1 1 1     1    SAB TAB Ectopic Multiple Live Births                     Patient Active Problem List   Diagnosis Date Noted  . Hyperparathyroidism, primary ([place]) 10/28/2016  . Thyroid nodule 10/28/2016    Past Medical History:  Diagnosis Date  . Abnormal uterine bleeding   . Anemia   . Elevated liver function tests   . Elevated parathyroid hormone 09/2016  . Fibroid   . Hypertension   . Serum calcium elevated 2017   Elevated PTH.    No past surgical history on file.  Current Outpatient Prescriptions  Medication Sig Dispense Refill  . amLODipine (NORVASC) 10 MG tablet Take 1 tablet by mouth daily.  1  . leuprolide (LUPRON DEPOT, 29-MONTH,) 3.75 MG injection Inject 3.75 mg into the muscle every 30 (thirty) days. 1 each 5  . losartan (COZAAR) 50 MG tablet Take 50 mg by mouth daily. Patient takes 1/2 tablet daily  0   No current facility-administered medications for this visit.      ALLERGIES: Patient has no allergy information on record.  Family History   Problem Relation Age of Onset  . Stroke Mother   . Hypertension Mother   . Breast cancer Sister 40    A & W  . Diabetes Paternal Grandmother   . Hypercalcemia Neg Hx     Social History   Social History  . Marital status: Single    Spouse name: N/A  . Number of children: N/A  . Years of education: N/A   Occupational History  . Not on file.   Social History Main Topics  . Smoking status: Never Smoker  . Smokeless tobacco: Never Used  . Alcohol use 1.2 oz/week    2 Standard drinks or equivalent per week  . Drug use: No  . Sexual activity: Yes    Partners: Male    Birth control/ protection: Condom     Comment: condoms most of the time   Other Topics Concern  . Not on file   Social History Narrative  . No narrative on file    ROS:  Pertinent items are noted in HPI.  PHYSICAL EXAMINATION:    BP 120/72 (BP Location: Right Arm, Patient Position: Sitting, Cuff Size: Large)   Pulse 88   Ht 5' 5.5" (1.664 m)  Wt 207 lb (93.9 kg)   BMI 33.92 kg/m     General appearance: alert, cooperative and appears stated age   Abdomen: 16 week size uterus with some mobility.  Pelvic: External genitalia:  no lesions              Urethra:  normal appearing urethra with no masses, tenderness or lesions              Bartholins and Skenes: normal                 Vagina: normal appearing vagina with normal color and discharge, no lesions              Cervix: no lesions             Bimanual Exam:  Uterus:  enlarged, 16 weeks size.  Does have some lateral mobility.  Large posterior LUS fibroid.              Adnexa: normal adnexa and no mass, fullness, tenderness               Chaperone was present for exam.  ASSESSMENT  Uterine fibroids.  Status post Depo Lupron for 6 months.   Good reduction in uterine size.  Anemia resolved with absence of menses. HTN.   PLAN  We discussed good response to Depo Lupron and proceeding forward with plan for total laparoscopic hysterectomy  with bilateral salpingectomy (and cystoscopy).  Risks and benefits reviewed with the patient. Risks include but are not limited to bleeding, infection, damage to surrounding organs, pneumonia, reaction to anesthesia, DVT, PE, death, need for reoperation, hernia formation, need to convert to a traditional laparotomy incision to complete the procedure.  She wishes to proceed forward with precerting and scheduling.  If menses return, she can take Micronor.  Out of work for at least 4 weeks following hysterectomy.   An After Visit Summary was printed and given to the patient.  __25____ minutes face to face time of which over 50% was spent in counseling.

## 2016-12-25 NOTE — Progress Notes (Signed)
Encounter reviewed by Dr. Brook Amundson C. Silva.  

## 2016-12-31 ENCOUNTER — Telehealth: Payer: Self-pay | Admitting: Obstetrics and Gynecology

## 2016-12-31 NOTE — Telephone Encounter (Signed)
Called patient to review benefits for a recommended surgical procedure. Left Voicemail requesting a call back.

## 2017-01-01 MED FILL — AMLODIPINE BESYLATE 10 MG T: 10 | 90 days supply | Qty: 90 | Fill #0

## 2017-01-06 NOTE — Telephone Encounter (Signed)
Spoke with patient regarding benefit for surgery. Patient understood information presented. Patient states will call back by Friday, 01/09/17 to confirm and proceed with scheduling. Staff message to Gay Filler for scheduling  Routing to [employer]

## 2017-01-08 NOTE — Telephone Encounter (Signed)
Call to patient to discuss surgery date options. Left message to call back. 

## 2017-01-12 ENCOUNTER — Telehealth: Payer: Self-pay | Admitting: *Deleted

## 2017-01-12 NOTE — Telephone Encounter (Signed)
Patient and I discussed Micronor if her menses return.  I am not recommending further Depo Lupron.  Ok to close encounter and have patient return call when she wishes to proceed with surgery.

## 2017-01-12 NOTE — Telephone Encounter (Signed)
See previous phone encounter. Patient calls back and has decided she wants to proceed with scheduling for 03-10-17 surgery date. Advised will schedule and call her back once confirmed.

## 2017-01-12 NOTE — Telephone Encounter (Signed)
Call to patient to check on surgery scheduling. Patient states she is now looking at possible May dates. Still working on [employer] and making arrangements to be out of work for 6 weeks. Discussed that available dates in May are very limited and that she needs to give several weeks of advanced notice to schedule a surgery date. The next available date after first week of may is first week of June.  Discussed that the further she waits past Lupron injection, the possibility of losing some of the benefits of the injection may occur. Patient states she will have to let us know when she is ready to schedule but she will not be ready till May or June.   Routing to provider for review.

## 2017-01-14 ENCOUNTER — Telehealth: Payer: Self-pay | Admitting: Obstetrics and Gynecology

## 2017-01-14 NOTE — Telephone Encounter (Signed)
Alise from [employer] called and left a message at lunch requesting a call back with our fax number and the claim number left on the voicemail: [phone]. I returned the call and left our fax number: [phone] with the claim number they supplied on Alise's voice mail as requested.  Routing to [employer] for FYI only.

## 2017-01-14 NOTE — Telephone Encounter (Signed)
Call to patient. Confirmed surgery is scheduled for 03-10-17 at 0730 at [facility]. 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 Patient will call back closer to date to schedule appointments and review instructions.  Routing to provider for final review. Patient agreeable to disposition. Will close encounter.

## 2017-01-19 ENCOUNTER — Telehealth: Payer: Self-pay | Admitting: Obstetrics and Gynecology

## 2017-01-19 NOTE — Telephone Encounter (Signed)
Patient called to check on the status of a Medical Certification form that was faxed to [place] by [employer] last week.

## 2017-01-22 DIAGNOSIS — Z0289 Encounter for other administrative examinations: Secondary | ICD-10-CM

## 2017-01-22 NOTE — Telephone Encounter (Signed)
FMLA forms policy followed and ready to proceed with completion.

## 2017-01-22 NOTE — Telephone Encounter (Signed)
I have signed the FMLA forms.

## 2017-01-22 NOTE — Telephone Encounter (Signed)
Call to patient to advise FMLA forms from [employer], has been received. Advised patient of form requirements. Patient understood information presented. Forms have been forwarded to Dr Quincy Simmonds for review.  Routing to Dr Quincy Simmonds

## 2017-01-23 NOTE — Telephone Encounter (Signed)
Thank you for the update. You may close the encounter. 

## 2017-01-23 NOTE — Telephone Encounter (Signed)
FMLA forms have been completed and faxed to [employer] at fax number [phone].  Fax confirmation received and transmittal was successful.  Routing to Dr Quincy Simmonds

## 2017-01-30 ENCOUNTER — Telehealth: Payer: Self-pay | Admitting: *Deleted

## 2017-01-30 NOTE — Telephone Encounter (Signed)
Call to patient. Confirmed surgery date of 03-10-17 at [facility].  Patient denies any medication allergies. No issues with antibiotics. Surgery instruction sheet reviewed and printed copy will be mailed to patient. All surgical appointments scheduled.  Bowel prep instructions reviewed with patient.  Routing to provider for final review. Patient agreeable to disposition. Will close encounter.

## 2017-02-16 MED FILL — LOSARTAN POTASSIUM 50 MG TA: 50 | 90 days supply | Qty: 45 | Fill #0

## 2017-02-16 NOTE — Progress Notes (Signed)
GYNECOLOGY  VISIT   HPI: 36 y.o.   Single  African American  female   [id number] with No LMP recorded. Patient has had an injection.   here for surgical consult.    Desires hysterectomy.  Declines future childbearing.   [place] 12/25/16 - 6 fibroids 2.71 - 5.3 cm  Normal ovaries.   Status post Depo Lupron x 6 months.   Last injection was 12/08/16.   Spotting here and there.   Now on Vit D for hyperparathyroidism.   Not a smoker.   GYNECOLOGIC HISTORY: No LMP recorded. Patient has had an injection. Contraception:  condoms Menopausal hormone therapy:  n/a Last mammogram:  n/a Last pap smear: 05-15-14 Neg.  Remote hx of abnormal pap and no treatment.         OB History    Gravida Para Term Preterm AB Living   1 1 1     1    SAB TAB Ectopic Multiple Live Births                     Patient Active Problem List   Diagnosis Date Noted  . Hyperparathyroidism, primary ([place]) 10/28/2016  . Thyroid nodule 10/28/2016    Past Medical History:  Diagnosis Date  . Abnormal uterine bleeding   . Anemia   . Elevated liver function tests   . Elevated parathyroid hormone 09/2016  . Fibroid   . Hypertension   . Serum calcium elevated 2017   Elevated PTH.    No past surgical history on file.  Current Outpatient Prescriptions  Medication Sig Dispense Refill  . amLODipine (NORVASC) 10 MG tablet Take 1 tablet by mouth daily.  1  . losartan (COZAAR) 50 MG tablet Take 50 mg by mouth daily. Patient takes 1/2 tablet daily  0  . leuprolide (LUPRON DEPOT, 50-MONTH,) 3.75 MG injection Inject 3.75 mg into the muscle every 30 (thirty) days. (Patient not taking: Reported on 02/19/2017) 1 each 5   No current facility-administered medications for this visit.      ALLERGIES: Patient has no allergy information on record.  Family History  Problem Relation Age of Onset  . Stroke Mother   . Hypertension Mother   . Breast cancer Sister 86       A & W  . Diabetes Paternal Grandmother   . Hypercalcemia  Neg Hx     Social History   Social History  . Marital status: Single    Spouse name: N/A  . Number of children: N/A  . Years of education: N/A   Occupational History  . Not on file.   Social History Main Topics  . Smoking status: Never Smoker  . Smokeless tobacco: Never Used  . Alcohol use 1.2 oz/week    2 Standard drinks or equivalent per week  . Drug use: No  . Sexual activity: Yes    Partners: Male    Birth control/ protection: Condom     Comment: condoms most of the time   Other Topics Concern  . Not on file   Social History Narrative  . No narrative on file    ROS:  Pertinent items are noted in HPI.  PHYSICAL EXAMINATION:    BP 124/82 (BP Location: Right Arm, Patient Position: Sitting, Cuff Size: Large)   Pulse 100   Ht 5' 5.5" (1.664 m)   Wt 212 lb (96.2 kg)   BMI 34.74 kg/m     General appearance: alert, cooperative and appears stated age Head: Normocephalic,  without obvious abnormality, atraumatic Neck: no adenopathy, supple, symmetrical, trachea midline and thyroid normal to inspection and palpation Lungs: clear to auscultation bilaterally Breasts: normal appearance, no masses or tenderness, No nipple retraction or dimpling, No nipple discharge or bleeding, No axillary or supraclavicular adenopathy Heart: regular rate and rhythm Abdomen:  18 week size uterus, some mobility left side greater than right side.  Uterus nontender. Extremities: extremities normal, atraumatic, no cyanosis or edema Skin: Skin color, texture, turgor normal. No rashes or lesions Lymph nodes: Cervical, supraclavicular, and axillary nodes normal. No abnormal inguinal nodes palpated Neurologic: Grossly normal  Pelvic: External genitalia:  no lesions              Urethra:  normal appearing urethra with no masses, tenderness or lesions              Bartholins and Skenes: normal                 Vagina: normal appearing vagina with normal color and discharge, no lesions               Cervix: no lesions.  Pap taken.                 Bimanual Exam:  Uterus:  17 week size.  Some mobility laterally.  Fibroid palpable in lower uterine segment.               Adnexa: no mass, fullness, tenderness            Chaperone was present for exam.  ASSESSMENT  Large uterine fibroids.  Status post Depo Lupron x 6 months.  Cervical cancer screening.  Remote hx of aboromal pap.  PLAN  Pap and HR HPV taken.  Proceed with total laparoscopic hysterectomy with bilateral salpingectomy, vaginal morcellation of fibroids using Alexis bag, and cystoscopy.  I reviewed  Risks, benefits, and alternatives.  Risks include but are not limited to bleeding, infection, damage to surrounding organs, pneumonia, reaction to anesthesia, DVT, PE, death, need for reoperation, hernia formation, vaginal cuff dehiscence, need to convert to a traditional laparotomy incision to complete the procedure. Our plan is for outpatient surgery.  Surgical expectations and recovery discussed.  OK to proceed.   An After Visit Summary was printed and given to the patient.  __15____ minutes face to face time of which over 50% was spent in counseling.

## 2017-02-19 ENCOUNTER — Encounter: Payer: Self-pay | Admitting: Obstetrics and Gynecology

## 2017-02-19 ENCOUNTER — Ambulatory Visit (INDEPENDENT_AMBULATORY_CARE_PROVIDER_SITE_OTHER): Payer: 59 | Admitting: Obstetrics and Gynecology

## 2017-02-19 VITALS — BP 124/82 | HR 100 | Ht 65.5 in | Wt 212.0 lb

## 2017-02-19 DIAGNOSIS — Z1151 Encounter for screening for human papillomavirus (HPV): Secondary | ICD-10-CM | POA: Diagnosis not present

## 2017-02-19 DIAGNOSIS — D259 Leiomyoma of uterus, unspecified: Secondary | ICD-10-CM

## 2017-02-19 DIAGNOSIS — Z124 Encounter for screening for malignant neoplasm of cervix: Secondary | ICD-10-CM

## 2017-02-23 LAB — IPS PAP TEST WITH HPV

## 2017-02-24 ENCOUNTER — Telehealth: Payer: Self-pay | Admitting: *Deleted

## 2017-02-24 MED ORDER — METRONIDAZOLE 500 MG PO TABS: 500.0000 mg | ORAL_TABLET | Freq: Two times a day (BID) | ORAL | 0 refills | Status: DC

## 2017-02-24 MED FILL — metroNIDAZOLE 500 MG TABS: 500 | 7 days supply | Qty: 14 | Fill #0

## 2017-02-24 NOTE — Telephone Encounter (Signed)
Left message to call Zelta Enfield at [phone].  

## 2017-02-24 NOTE — Telephone Encounter (Signed)
-----   Message from Salvadore Dom, MD sent at 02/23/2017  4:51 PM EDT ----- Please inform the patient that her pap is negative with negative hpv. She does have BV, I would recommend treating the BV prior to surgery. Please call in flagyl 500 mg bid x 7 days. No ETOH while on flagyl or for 24 hours after coming off of flagyl.

## 2017-02-24 NOTE — Telephone Encounter (Signed)
Spoke with patient, advised of results and recommendations as seen below per Dr. Talbert Nan. Rx for flagyl to verified pharmacy on file. Patient verbalizes understanding and is agreeable.  Routing to provider for final review. Patient is agreeable to disposition. Will close encounter.  Cc: Dr. Quincy Simmonds

## 2017-03-03 ENCOUNTER — Encounter (HOSPITAL_BASED_OUTPATIENT_CLINIC_OR_DEPARTMENT_OTHER): Payer: Self-pay | Admitting: *Deleted

## 2017-03-03 NOTE — Progress Notes (Signed)
NPO AFTER MN.  ARRIVE AT 0530.  NEEDS T&S, URINE PREG., EKG,  AND LOVENOX INJECTION ON ARRIVAL.  GETTING LAB WORK DONE Friday 03-06-2017 (CBC, CMET). WILL TAKE AM MEDS DOS W/ SIPS OF WATER.

## 2017-03-06 ENCOUNTER — Encounter: Payer: Self-pay | Admitting: Obstetrics and Gynecology

## 2017-03-06 ENCOUNTER — Telehealth: Payer: Self-pay | Admitting: *Deleted

## 2017-03-06 ENCOUNTER — Encounter (HOSPITAL_BASED_OUTPATIENT_CLINIC_OR_DEPARTMENT_OTHER)
Admission: RE | Admit: 2017-03-06 | Discharge: 2017-03-06 | Disposition: A | Discharge status: Home / Self Care | Payer: 59 | Source: Ambulatory Visit | Attending: Obstetrics and Gynecology | Admitting: Obstetrics and Gynecology

## 2017-03-06 DIAGNOSIS — E041 Nontoxic single thyroid nodule: Secondary | ICD-10-CM | POA: Insufficient documentation

## 2017-03-06 DIAGNOSIS — N939 Abnormal uterine and vaginal bleeding, unspecified: Secondary | ICD-10-CM | POA: Diagnosis not present

## 2017-03-06 DIAGNOSIS — D259 Leiomyoma of uterus, unspecified: Secondary | ICD-10-CM | POA: Insufficient documentation

## 2017-03-06 DIAGNOSIS — Z79899 Other long term (current) drug therapy: Secondary | ICD-10-CM | POA: Diagnosis not present

## 2017-03-06 DIAGNOSIS — R945 Abnormal results of liver function studies: Secondary | ICD-10-CM | POA: Diagnosis not present

## 2017-03-06 DIAGNOSIS — Z803 Family history of malignant neoplasm of breast: Secondary | ICD-10-CM | POA: Diagnosis not present

## 2017-03-06 DIAGNOSIS — R739 Hyperglycemia, unspecified: Secondary | ICD-10-CM | POA: Diagnosis not present

## 2017-03-06 DIAGNOSIS — Z01812 Encounter for preprocedural laboratory examination: Secondary | ICD-10-CM | POA: Diagnosis not present

## 2017-03-06 DIAGNOSIS — I10 Essential (primary) hypertension: Secondary | ICD-10-CM | POA: Diagnosis not present

## 2017-03-06 DIAGNOSIS — Z823 Family history of stroke: Secondary | ICD-10-CM | POA: Diagnosis not present

## 2017-03-06 DIAGNOSIS — R7989 Other specified abnormal findings of blood chemistry: Secondary | ICD-10-CM | POA: Diagnosis not present

## 2017-03-06 DIAGNOSIS — E210 Primary hyperparathyroidism: Secondary | ICD-10-CM | POA: Insufficient documentation

## 2017-03-06 DIAGNOSIS — Z833 Family history of diabetes mellitus: Secondary | ICD-10-CM | POA: Insufficient documentation

## 2017-03-06 LAB — COMPREHENSIVE METABOLIC PANEL
ALBUMIN: 4.2 g/dL (ref 3.5–5.0)
ALT: 128 U/L — ABNORMAL HIGH (ref 14–54)
ANION GAP: 9 (ref 5–15)
AST: 77 U/L — ABNORMAL HIGH (ref 15–41)
Alkaline Phosphatase: 81 U/L (ref 38–126)
BUN: 9 mg/dL (ref 6–20)
CHLORIDE: 102 mmol/L (ref 101–111)
CO2: 23 mmol/L (ref 22–32)
Calcium: 10.5 mg/dL — ABNORMAL HIGH (ref 8.9–10.3)
Creatinine, Ser: 0.75 mg/dL (ref 0.44–1.00)
GFR calc Af Amer: >60 mL/min (ref >60)
GFR calc non Af Amer: >60 mL/min (ref >60)
GLUCOSE: 190 mg/dL — AB (ref 65–99)
POTASSIUM: 3.8 mmol/L (ref 3.5–5.1)
Sodium: 134 mmol/L — ABNORMAL LOW (ref 135–145)
Total Bilirubin: 0.6 mg/dL (ref 0.3–1.2)
Total Protein: 8.1 g/dL (ref 6.5–8.1)

## 2017-03-06 LAB — CBC
HCT: 42.3 % (ref 36.0–46.0)
HEMOGLOBIN: 14.5 g/dL (ref 12.0–15.0)
MCH: 30.3 pg (ref 26.0–34.0)
MCHC: 34.3 g/dL (ref 30.0–36.0)
MCV: 88.3 fL (ref 78.0–100.0)
PLATELETS: 281 10*3/uL (ref 150–400)
RBC: 4.79 MIL/uL (ref 3.87–5.11)
RDW: 14.4 % (ref 11.5–15.5)
WBC: 10.6 10*3/uL — ABNORMAL HIGH (ref 4.0–10.5)

## 2017-03-06 NOTE — Telephone Encounter (Signed)
Return call to patient. Has appointment today at 80 with [facility] [name] PA. Needs labs faxed to [phone]. Patient will send update thru My Chart over weekend.

## 2017-03-06 NOTE — Telephone Encounter (Signed)
Routing to Sally Yeakley, RN. 

## 2017-03-06 NOTE — Telephone Encounter (Signed)
Lab results received by Dr Quincy Simmonds. Due to elevated blood glucose and liver enzymes, which have even increased from previous levels, will need to cancel surgery scheduled for 03-09-17 and see PCP for evaluation and medical clearance.

## 2017-03-06 NOTE — Telephone Encounter (Signed)
Call to patient. Advised of lab results and recommendations as directed by Dr Quincy Simmonds.  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 She ate cake and party food until 4 am, labs drawn at 830.  Advised blood glucose should not have been this elevated 4 hours later.  Discussed that we do not want to increase surgery risk by proceeding with a possible undiagnosed medical condition.  Advised to call PCP for appointment for follow-up of lab work and clearance for surgery. Patient agreeable and will let me know when appointment is.

## 2017-03-06 NOTE — Telephone Encounter (Signed)
Patient would like to speak with nurse about surgery scheduled for Monday and getting labs sent over to her primary care office.

## 2017-03-09 ENCOUNTER — Other Ambulatory Visit (HOSPITAL_COMMUNITY): Payer: Self-pay | Admitting: Family Medicine

## 2017-03-09 DIAGNOSIS — R7989 Other specified abnormal findings of blood chemistry: Secondary | ICD-10-CM

## 2017-03-09 DIAGNOSIS — R945 Abnormal results of liver function studies: Principal | ICD-10-CM

## 2017-03-09 NOTE — Telephone Encounter (Signed)
Thank you for the update.  Please let my surgical assistant know.

## 2017-03-09 NOTE — Telephone Encounter (Signed)
Call to patient for update from PCP appointment.  States PCP "not too worried" but has ordered additional labs and ultrasound for this week. Agrees we should cancel surgery until results are back.  Patient will call back to reschedule as soon as she is cleared from PCP.  Routing to provider for final review. Patient agreeable to disposition. Will close encounter.

## 2017-03-10 ENCOUNTER — Ambulatory Visit (HOSPITAL_BASED_OUTPATIENT_CLINIC_OR_DEPARTMENT_OTHER): Admission: RE | Admit: 2017-03-10 | Payer: 59 | Source: Ambulatory Visit | Admitting: Obstetrics and Gynecology

## 2017-03-10 ENCOUNTER — Telehealth: Payer: Self-pay | Admitting: Obstetrics and Gynecology

## 2017-03-10 HISTORY — DX: Leiomyoma of uterus, unspecified: D25.9

## 2017-03-10 HISTORY — DX: Hypercalcemia: E83.52

## 2017-03-10 HISTORY — DX: Primary hyperparathyroidism: E21.0

## 2017-03-10 HISTORY — DX: Thyrotoxicosis, unspecified without thyrotoxic crisis or storm: E05.90

## 2017-03-10 HISTORY — DX: Gastro-esophageal reflux disease without esophagitis: K21.9

## 2017-03-10 HISTORY — DX: Nontoxic single thyroid nodule: E04.1

## 2017-03-10 SURGERY — HYSTERECTOMY, TOTAL, LAPAROSCOPIC, WITH SALPINGECTOMY
Anesthesia: General | Laterality: Bilateral

## 2017-03-10 MED FILL — METFORMIN HCL ER 500 MG TAB: 500 | 90 days supply | Qty: 180 | Fill #0

## 2017-03-10 NOTE — Telephone Encounter (Signed)
Phone call in follow up to cancelled surgery.  I left a message that I wanted to reach out to her personally and get an update in her care and assist in making plans for her follow up care.

## 2017-03-11 ENCOUNTER — Encounter: Payer: Self-pay | Admitting: Obstetrics and Gynecology

## 2017-03-12 ENCOUNTER — Encounter: Payer: Self-pay | Admitting: Obstetrics and Gynecology

## 2017-03-12 ENCOUNTER — Ambulatory Visit (HOSPITAL_COMMUNITY)
Admission: RE | Admit: 2017-03-12 | Discharge: 2017-03-12 | Disposition: A | Discharge status: Home / Self Care | Payer: 59 | Source: Ambulatory Visit | Attending: Family Medicine | Admitting: Family Medicine

## 2017-03-12 ENCOUNTER — Ambulatory Visit (INDEPENDENT_AMBULATORY_CARE_PROVIDER_SITE_OTHER): Payer: 59 | Admitting: Obstetrics and Gynecology

## 2017-03-12 VITALS — BP 122/78 | HR 96 | Resp 16 | Wt 211.0 lb

## 2017-03-12 DIAGNOSIS — K760 Fatty (change of) liver, not elsewhere classified: Secondary | ICD-10-CM | POA: Insufficient documentation

## 2017-03-12 DIAGNOSIS — N920 Excessive and frequent menstruation with regular cycle: Secondary | ICD-10-CM | POA: Diagnosis not present

## 2017-03-12 DIAGNOSIS — R945 Abnormal results of liver function studies: Secondary | ICD-10-CM

## 2017-03-12 DIAGNOSIS — D259 Leiomyoma of uterus, unspecified: Secondary | ICD-10-CM | POA: Diagnosis not present

## 2017-03-12 DIAGNOSIS — K824 Cholesterolosis of gallbladder: Secondary | ICD-10-CM | POA: Insufficient documentation

## 2017-03-12 DIAGNOSIS — R7989 Other specified abnormal findings of blood chemistry: Secondary | ICD-10-CM | POA: Insufficient documentation

## 2017-03-12 LAB — LAB REPORT - SCANNED: HEMOGLOBIN: 12.9

## 2017-03-12 MED ORDER — KETOROLAC TROMETHAMINE 10 MG PO TABS: 10.0000 mg | ORAL_TABLET | Freq: Four times a day (QID) | ORAL | 1 refills | Status: DC | PRN

## 2017-03-12 MED ORDER — MEGESTROL ACETATE 40 MG PO TABS: 40.0000 mg | ORAL_TABLET | Freq: Two times a day (BID) | ORAL | 1 refills | Status: DC

## 2017-03-12 MED FILL — KETOROLAC 10 MG TABLET: 10 | 5 days supply | Qty: 20 | Fill #0

## 2017-03-12 MED FILL — MEGESTROL 40 MG TABLET: 40 | 30 days supply | Qty: 60 | Fill #0

## 2017-03-12 NOTE — Progress Notes (Signed)
GYNECOLOGY  VISIT   HPI: 36 y.o.   Single  African American  female   [id number] with Patient's last menstrual period was 03/11/2017.   here for painful period/heavy bleeding since yesterday. Asking about FLMA due to multiple absences at work for her current care.  Hysterectomy cancelled due to elevated glucose and elevated LFTs.  Has fibroids.   This is her first menses since taking 6 months of depo Lupron.  Pad change every hour.  Clotting.  Cramping.  Some dizziness.  Missing work.  Asking about FMLA.   Hgb A1C 8 at PCP.  Had GMD.  Will start on Metformin.  Going to see Nutrition specialist.  Really motivated to make change and loose weight.  Has abdominal US today. Had a lot of lab work done as well.   FH of diabetes.  GYNECOLOGIC HISTORY: Patient's last menstrual period was 03/11/2017. Contraception: condoms  Menopausal hormone therapy:  n/a Last mammogram:  n/a Last pap smear:   02/19/17 Pap and HR HPV negative        OB History    Gravida Para Term Preterm AB Living   1 1 1     1    SAB TAB Ectopic Multiple Live Births                     Patient Active Problem List   Diagnosis Date Noted  . Hyperparathyroidism, primary ([place]) 10/28/2016  . Thyroid nodule 10/28/2016    Past Medical History:  Diagnosis Date  . Abnormal uterine bleeding   . Diabetes mellitus without complication ([place])   . Elevated hemoglobin A1c   . Elevated liver function tests 05/28/2016   per lab results  . GERD (gastroesophageal reflux disease)   . Hypercalcemia due to hyperthyroidism   . Hyperparathyroidism, primary [facility])    endocrinologist-  dr Loanne Drilling  . Hypertension   . Thyroid nodule    per endocrinoloigst note (dr ellision)  secondary to primary hyperparathyroidism  . Uterine fibroid     Past Surgical History:  Procedure Laterality Date  . NO PAST SURGERIES      Current Outpatient Prescriptions  Medication Sig Dispense Refill  . amLODipine (NORVASC) 10 MG tablet Take  1 tablet by mouth every morning.   1  . losartan (COZAAR) 50 MG tablet Take 50 mg by mouth every morning. Patient takes 1/2 tablet daily  0  . omeprazole (PRILOSEC) 20 MG capsule Take 20 mg by mouth as needed.    . metFORMIN (GLUCOPHAGE-XR) 500 MG 24 hr tablet Take 1 tablet by mouth 2 (two) times daily.  0   No current facility-administered medications for this visit.      ALLERGIES: Patient has no known allergies.  Family History  Problem Relation Age of Onset  . Stroke Mother   . Hypertension Mother   . Breast cancer Sister 47       A & W  . Diabetes Paternal Grandmother   . Hypercalcemia Neg Hx     Social History   Social History  . Marital status: Single    Spouse name: N/A  . Number of children: N/A  . Years of education: N/A   Occupational History  . Not on file.   Social History Main Topics  . Smoking status: Never Smoker  . Smokeless tobacco: Never Used  . Alcohol use 1.2 oz/week    2 Standard drinks or equivalent per week     Comment: occasional  . Drug use: No  .  Sexual activity: Yes    Partners: Male    Birth control/ protection: Condom     Comment: condoms most of the time   Other Topics Concern  . Not on file   Social History Narrative  . No narrative on file    ROS:  Pertinent items are noted in HPI.  PHYSICAL EXAMINATION:    BP 122/78 (BP Location: Right Arm, Patient Position: Sitting, Cuff Size: Normal)   Pulse 96   Resp 16   Wt 211 lb (95.7 kg)   LMP 03/11/2017   BMI 34.58 kg/m     General appearance: alert, cooperative and appears stated age Head: Normocephalic, without obvious abnormality, atraumatic Neck: no adenopathy, supple, symmetrical, trachea midline and thyroid normal to inspection and palpation Lungs: clear to auscultation bilaterally Heart: regular rate and rhythm Abdomen: uterus to above umbilicus on left side of abdomen and just below the umbilicus on right side of the abdomen, soft, non-tender,     Pelvic: External  genitalia:  no lesions              Urethra:  normal appearing urethra with no masses, tenderness or lesions              Bartholins and Skenes: normal                 Vagina: normal appearing vagina with normal color and discharge, no lesions              Cervix: only anterior lip seen.  Clot in the vagina.                 Bimanual Exam:  Uterus:  20 week size and heavy. LUS fibroid palpable.               Adnexa: no mass, fullness, tenderness             Chaperone was present for exam.  ASSESSMENT  Fibroids. Symptomatic.  HTN.  DM.  Elevated LFTs.   PLAN  UPT:  Negative. Check Hgb and CBC now.   Hgb 12.9 now.  Start Metformin.  Discussed importance of weight loss.  Toradol 10 mg po q 6 hours prn.  #20, RF one.  Megace 40 mg po bid. #60, RF one.  Abdominal ultrasound today.  Will need follow up in about 6 weeks.  I did agree that she will need FMLA due to her multiple office visits and her menorrhagia.  This will likely need to be intermittent FMLA.  Will follow up in 6 - 8 weeks, sooner as needed.   An After Visit Summary was printed and given to the patient.  ___25___ minutes face to face time of which over 50% was spent in counseling.

## 2017-03-13 LAB — CBC
Hematocrit: 39 % (ref 34.0–46.6)
Hemoglobin: 12.7 g/dL (ref 11.1–15.9)
MCH: 29 pg (ref 26.6–33.0)
MCHC: 32.6 g/dL (ref 31.5–35.7)
MCV: 89 fL (ref 79–97)
Platelets: 312 10*3/uL (ref 150–379)
RBC: 4.38 x10E6/uL (ref 3.77–5.28)
RDW: 15 % (ref 12.3–15.4)
WBC: 9.4 10*3/uL (ref 3.4–10.8)

## 2017-03-16 ENCOUNTER — Ambulatory Visit: Payer: 59 | Admitting: Obstetrics and Gynecology

## 2017-03-18 DIAGNOSIS — R945 Abnormal results of liver function studies: Secondary | ICD-10-CM | POA: Diagnosis not present

## 2017-03-18 DIAGNOSIS — K760 Fatty (change of) liver, not elsewhere classified: Secondary | ICD-10-CM | POA: Diagnosis not present

## 2017-03-18 DIAGNOSIS — K824 Cholesterolosis of gallbladder: Secondary | ICD-10-CM | POA: Diagnosis not present

## 2017-03-19 MED FILL — VITAMIN E HIGH POTENCY 400: 400 | 50 days supply | Qty: 100 | Fill #0

## 2017-03-27 ENCOUNTER — Encounter: Payer: Self-pay | Admitting: Obstetrics and Gynecology

## 2017-04-07 ENCOUNTER — Telehealth: Payer: Self-pay | Admitting: *Deleted

## 2017-04-07 NOTE — Telephone Encounter (Signed)
I would recommend FMLA that states she may need to be absent from work for 1 - 2 days per week until surgery due to appointments or heavy bleeding.  This is the best estimate I can give at this time.  Thank you for making the follow up appointment.

## 2017-04-07 NOTE — Telephone Encounter (Signed)
Intermittent Leave Forms forwarded to Dr Quincy Simmonds for review.  Routing to Dr Quincy Simmonds  cc: Lamont Snowball, RN

## 2017-04-07 NOTE — Telephone Encounter (Signed)
I signed her FMLA papers.

## 2017-04-07 NOTE — Telephone Encounter (Signed)
Completed FMLA forms, for Intermittent Leave have been faxed to [facility] (fax number [phone]). Fax confirmation received noting the transmittal was submitted successfully.

## 2017-04-07 NOTE — Telephone Encounter (Signed)
Call to patient regarding follow-up appointment and status since off Lupron.  States has not had another menses since here on 03-12-17 but likely to begin again anytime.  Has been to endocrinologist on 03-13-17 and started on Metformin but has no scheduled return appointments. Scheduled 6 week follow-up appointment here for 04-22-17.  Has not missed any more work since 6-7 and 03-13-17 since has not had another cycle. Advised will complete FMLA forms and submit.  Routing to Dr Quincy Simmonds for review.

## 2017-04-09 NOTE — Telephone Encounter (Signed)
Routing to provider for final review. Patient agreeable to disposition. Will close encounter.     

## 2017-04-10 MED FILL — AMLODIPINE BESYLATE 10 MG T: 10 | 90 days supply | Qty: 90 | Fill #1

## 2017-04-13 MED FILL — MEGESTROL 40 MG TABLET: 40 | 30 days supply | Qty: 60 | Fill #1

## 2017-04-20 ENCOUNTER — Ambulatory Visit: Payer: 59 | Admitting: Obstetrics and Gynecology

## 2017-04-22 ENCOUNTER — Ambulatory Visit (INDEPENDENT_AMBULATORY_CARE_PROVIDER_SITE_OTHER): Payer: 59 | Admitting: Obstetrics and Gynecology

## 2017-04-22 VITALS — BP 120/90 | HR 100 | Resp 16 | Ht 65.5 in | Wt 201.0 lb

## 2017-04-22 DIAGNOSIS — R7309 Other abnormal glucose: Secondary | ICD-10-CM

## 2017-04-22 DIAGNOSIS — R945 Abnormal results of liver function studies: Secondary | ICD-10-CM | POA: Diagnosis not present

## 2017-04-22 DIAGNOSIS — D259 Leiomyoma of uterus, unspecified: Secondary | ICD-10-CM | POA: Diagnosis not present

## 2017-04-22 NOTE — Progress Notes (Signed)
GYNECOLOGY  VISIT   HPI: 36 y.o.   Single  African American  female   [id number] with Patient's last menstrual period was 03/11/2017.   here for follow up menorrhagia and fibroids.   Hysterectomy cancelled due to new dx of diabetes.  Feels the fibroid on the right like it has grown. Some urinary frequency.   On Megace 40 mg twice daily.  No side effects. No bleeding at all currently.   Checking FSBG 4 times per week - 80 - 85 in the am.  110 at random times.  On Metformin 500 mg twice daily.  Sees Marda Stalker, PA, [place] at [facility].  No follow up plan.   Also had elevated liver function studies noted at preop hospital visit:  AST 77, ALT 128.  Abdominal US showed fatty liver disease and 4 mm gallbladder polyp.  Had know hyperparathyroidism and has seen endocrinology.   GYNECOLOGIC HISTORY: Patient's last menstrual period was 03/11/2017. Contraception: condoms  Menopausal hormone therapy:  n/a Last mammogram:  n/a Last pap smear:   02/19/17 Pap and HR HPV negative        OB History    Gravida Para Term Preterm AB Living   1 1 1     1    SAB TAB Ectopic Multiple Live Births                     Patient Active Problem List   Diagnosis Date Noted  . Hyperparathyroidism, primary ([place]) 10/28/2016  . Thyroid nodule 10/28/2016    Past Medical History:  Diagnosis Date  . Abnormal uterine bleeding   . Diabetes mellitus without complication ([place])   . Elevated hemoglobin A1c   . Elevated liver function tests 05/28/2016   per lab results  . GERD (gastroesophageal reflux disease)   . Hypercalcemia due to hyperthyroidism   . Hyperparathyroidism, primary [facility])    endocrinologist-  dr Loanne Drilling  . Hypertension   . Thyroid nodule    per endocrinoloigst note (dr ellision)  secondary to primary hyperparathyroidism  . Uterine fibroid     Past Surgical History:  Procedure Laterality Date  . NO PAST SURGERIES      Current Outpatient Prescriptions  Medication Sig Dispense  Refill  . amLODipine (NORVASC) 10 MG tablet Take 1 tablet by mouth every morning.   1  . ketorolac (TORADOL) 10 MG tablet Take 1 tablet (10 mg total) by mouth every 6 (six) hours as needed. 20 tablet 1  . losartan (COZAAR) 50 MG tablet Take 50 mg by mouth every morning. Patient takes 1/2 tablet daily  0  . megestrol (MEGACE) 40 MG tablet Take 1 tablet (40 mg total) by mouth 2 (two) times daily. 60 tablet 1  . metFORMIN (GLUCOPHAGE-XR) 500 MG 24 hr tablet Take 1 tablet by mouth 2 (two) times daily.  0  . omeprazole (PRILOSEC) 20 MG capsule Take 20 mg by mouth as needed.    Marland Kitchen VITAMIN E HIGH POTENCY 400 units capsule Take 1 capsule by mouth daily.  11   No current facility-administered medications for this visit.      ALLERGIES: Patient has no known allergies.  Family History  Problem Relation Age of Onset  . Stroke Mother   . Hypertension Mother   . Breast cancer Sister 29       A & W  . Diabetes Paternal Grandmother   . Hypercalcemia Neg Hx     Social History   Social History  .  Marital status: Single    Spouse name: N/A  . Number of children: N/A  . Years of education: N/A   Occupational History  . Not on file.   Social History Main Topics  . Smoking status: Never Smoker  . Smokeless tobacco: Never Used  . Alcohol use 1.2 oz/week    2 Standard drinks or equivalent per week     Comment: occasional  . Drug use: No  . Sexual activity: Yes    Partners: Male    Birth control/ protection: Condom     Comment: condoms most of the time   Other Topics Concern  . Not on file   Social History Narrative  . No narrative on file    ROS:  Pertinent items are noted in HPI.  PHYSICAL EXAMINATION:    BP 120/90 (BP Location: Right Arm, Patient Position: Sitting, Cuff Size: Large)   Pulse 100   Resp 16   Ht 5' 5.5" (1.664 m)   Wt 201 lb (91.2 kg)   LMP 03/11/2017   BMI 32.94 kg/m     General appearance: alert, cooperative and appears stated age   Abdomen: soft,  non-tender, uterus to almost 4 cm above the umbilicus on the right.  Left side to just above the umbilicus.   Pelvic: External genitalia:  no lesions              Urethra:  normal appearing urethra with no masses, tenderness or lesions              Bartholins and Skenes: normal                 Vagina: normal appearing vagina with normal color and discharge, no lesions              Cervix: no lesions                Bimanual Exam:  Uterus:  24 week size, anterior LUS fibroid is displacing the cervix up behind the pubic symphysis.               Adnexa: not able to palpate separately from the uterus.               Chaperone was present for exam.  ASSESSMENT  Large uterine fibroids.  Increased uterine size since off Depo Lupron. No bleeding on Megace. DM.  On Metformin.  Elevated liver function studies with fatty liver disease.  Gallbladder polyp.  Hyperparathyroidism.  FH breast cancer.  (Noted after the appointment.)  PLAN  Patient and I discussed changing her surgical approach to a total abdominal hysterectomy with bilateral salpingectomy.  Our goal is to use a Pfannenstiel incision.  She is ok with this.  I will check a hemoglobin V2Z and metabolic profile today.  Will need to review her family breast cancer hx.  If this is a blood relative, she will need to do a mammogram.   An After Visit Summary was printed and given to the patient.  ___15__ minutes face to face time of which over 50% was spent in counseling.

## 2017-04-23 LAB — HEMOGLOBIN A1C
ESTIMATED AVERAGE GLUCOSE: 123 mg/dL
Hgb A1c MFr Bld: 5.9 % — ABNORMAL HIGH (ref 4.8–5.6)

## 2017-04-23 LAB — BASIC METABOLIC PANEL
BUN/Creatinine Ratio: 8 — ABNORMAL LOW (ref 9–23)
BUN: 7 mg/dL (ref 6–20)
CALCIUM: 11.5 mg/dL — AB (ref 8.7–10.2)
CHLORIDE: 101 mmol/L (ref 96–106)
CO2: 19 mmol/L — AB (ref 20–29)
Creatinine, Ser: 0.88 mg/dL (ref 0.57–1.00)
GFR calc Af Amer: 96 mL/min/{1.73_m2} (ref >59)
GFR calc non Af Amer: 84 mL/min/{1.73_m2} (ref >59)
GLUCOSE: 84 mg/dL (ref 65–99)
Potassium: 4.1 mmol/L (ref 3.5–5.2)
Sodium: 138 mmol/L (ref 134–144)

## 2017-04-24 ENCOUNTER — Telehealth: Payer: Self-pay | Admitting: Obstetrics and Gynecology

## 2017-04-24 ENCOUNTER — Encounter: Payer: Self-pay | Admitting: Obstetrics and Gynecology

## 2017-04-24 NOTE — Telephone Encounter (Signed)
Opened in error

## 2017-04-27 ENCOUNTER — Telehealth: Payer: Self-pay

## 2017-04-27 ENCOUNTER — Encounter: Payer: Self-pay | Admitting: Obstetrics and Gynecology

## 2017-04-27 NOTE — Telephone Encounter (Signed)
Spoke with Elmyra Ricks at [facility]. Hepatic function panel added to lab work. Left message to call Ruth at [phone].

## 2017-04-27 NOTE — Telephone Encounter (Signed)
-----   Message from [name] Cobbs, MD sent at 04/24/2017 10:01 PM EDT ----- Results to patient through My Chart. Please add LFTs to labs already drawn due to hx of elevated LFTs. Please confirm FH of breast cancer.  If her sister is a blood relative, she needs a mammogram.  [name] [name],   I am happy to report that your hemoglobin A1C has improved from the level noted at your primary care provider.  The level is now down into a much better range.  The Metformin is working well so please continue it.   Your blood chemistries are showing elevated calcium which is expected with your hyperparathyroidism.  I will ask for the lab to add your liver function studies onto the blood already drawn.  I was reviewing your chart just now and saw your family history of your sister with breast cancer.  I will have the triage nurse discuss this with you further.  If she is a blood relative, you will need to proceed with a mammogram.   Please schedule a follow up appointment with your primary care provider for follow up there.  I think this is important to be done prior to proceeding with your surgery.  I will need them to officially report back to me that you are cleared for surgery.  Have a good weekend!  [name] Half, MD   [name] [name]

## 2017-04-27 NOTE — Telephone Encounter (Signed)
Spoke with patient. Advised of message as seen below from [place]. Patient states that her half sister had breast cancer. Had negative genetic testing. Patient declines to have a mammogram at this time. States she does not wish to proceed until she is 20. Aware of recommendations further earlier evaluation with [facility] but patient declines. States she will schedule an appointment with her PCP for surgical clearance. Aware she will be contacted with LFT results.  Routing to provider for final review. Patient agreeable to disposition. Will close encounter.

## 2017-05-01 ENCOUNTER — Encounter: Payer: Self-pay | Admitting: Obstetrics and Gynecology

## 2017-05-01 DIAGNOSIS — Z7984 Long term (current) use of oral hypoglycemic drugs: Secondary | ICD-10-CM | POA: Diagnosis not present

## 2017-05-01 DIAGNOSIS — I10 Essential (primary) hypertension: Secondary | ICD-10-CM | POA: Diagnosis not present

## 2017-05-01 DIAGNOSIS — D259 Leiomyoma of uterus, unspecified: Secondary | ICD-10-CM | POA: Diagnosis not present

## 2017-05-01 DIAGNOSIS — E1165 Type 2 diabetes mellitus with hyperglycemia: Secondary | ICD-10-CM | POA: Diagnosis not present

## 2017-05-01 MED FILL — LOSARTAN POTASSIUM 50 MG TA: 50 | 90 days supply | Qty: 45 | Fill #0

## 2017-05-05 ENCOUNTER — Encounter: Payer: Self-pay | Admitting: Obstetrics and Gynecology

## 2017-05-05 ENCOUNTER — Telehealth: Payer: Self-pay | Admitting: *Deleted

## 2017-05-05 LAB — HEPATIC FUNCTION PANEL
ALBUMIN: 4.7 g/dL (ref 3.5–5.5)
ALT: 31 IU/L (ref 0–32)
AST: 24 IU/L (ref 0–40)
Alkaline Phosphatase: 68 IU/L (ref 39–117)
Bilirubin Total: 0.4 mg/dL (ref 0.0–1.2)
Bilirubin, Direct: 0.1 mg/dL (ref 0.00–0.40)
Total Protein: 8.3 g/dL (ref 6.0–8.5)

## 2017-05-05 LAB — SPECIMEN STATUS REPORT

## 2017-05-05 NOTE — Telephone Encounter (Signed)
My Chart message from patient:  Hi Dr.Silva,  I went to see my primary doctor on Friday. She gave me the ok to schedule my surgery. When I last spoke to one of your nurses about my lab results she told me to have the dr fax over her office visit notes. I was just making sure that she did so that we can go ahead and schedule the surgery.   Thanks  [employer]

## 2017-05-07 ENCOUNTER — Encounter: Payer: Self-pay | Admitting: Obstetrics and Gynecology

## 2017-05-07 NOTE — Telephone Encounter (Signed)
Please contact the patient in follow up to select a date for her total abdominal hysterectomy with bilateral salpingectomy and possible cystoscopy for uterine fibroids.   We will need a copy of her visit with her PCP which cleared her for surgery.

## 2017-05-08 NOTE — Telephone Encounter (Signed)
Call to patient. Discussed surgery date options.  Surgery scheduled for 06-01-17 at 0730 at [facility]. Surgery instruction sheet reviewed and printed copy will be mailed to patient.

## 2017-05-08 NOTE — Telephone Encounter (Signed)
Call to [facility], [place]) Requesting  visit note, labs and surgical clearance. Will send to medical records to have this info faxed to my attention.

## 2017-05-11 ENCOUNTER — Telehealth: Payer: Self-pay | Admitting: Obstetrics and Gynecology

## 2017-05-11 NOTE — Telephone Encounter (Signed)
I signed the forms and will place them on your desk,  Thank you!

## 2017-05-11 NOTE — Telephone Encounter (Signed)
Received FMLA forms, via fax on 05/08/17. Forms have been forwarded to Dr Quincy Simmonds for review.  Routing to Dr Quincy Simmonds  cc: Lamont Snowball, RN

## 2017-05-12 NOTE — Telephone Encounter (Signed)
Completed FMLA forms have been faxed to [employer] at fax number [phone].  Fax confirmation received reflecting fax transmittal was successful.  Routing to Dr Quincy Simmonds

## 2017-05-12 NOTE — Telephone Encounter (Signed)
Routing to provider for final review. Will close encounter.     

## 2017-05-14 ENCOUNTER — Other Ambulatory Visit: Payer: Self-pay | Admitting: Obstetrics and Gynecology

## 2017-05-14 MED FILL — MEGESTROL 40 MG TABLET: 40 | 20 days supply | Qty: 40 | Fill #0

## 2017-05-14 NOTE — Telephone Encounter (Signed)
Medication refill request: megace 40mg  Last AEX:  ? Next AEX: not scheduled Last MMG (if hormonal medication request): none Refill authorized: last given 03-12-17 #60/ 0 refills. Twice daily. Please approve or deny.

## 2017-05-14 NOTE — Telephone Encounter (Signed)
Several attempts to obtain records made to PCP office. Records still not yet received.

## 2017-05-15 NOTE — Telephone Encounter (Signed)
Office notes from [place] PA received indicating cleared for surgery. Sent to your office for review. Will fax to hospital after you review.  Encounter closed.

## 2017-05-15 NOTE — Telephone Encounter (Signed)
Call placed on 05-14-17 to [place] by [facility] regarding need for surgical clearance. Fax received after being sent manually but only includes labs and does not have office noted from physician.  Call back to [facility]. Requested physician office note stating cleared for surgery. Patient has pre-op with Dr Quincy Simmonds on Tuesday 05-19-17 at 815 am.

## 2017-05-19 ENCOUNTER — Encounter: Payer: Self-pay | Admitting: Obstetrics and Gynecology

## 2017-05-19 ENCOUNTER — Ambulatory Visit (INDEPENDENT_AMBULATORY_CARE_PROVIDER_SITE_OTHER): Payer: 59 | Admitting: Obstetrics and Gynecology

## 2017-05-19 VITALS — BP 120/72 | HR 70 | Resp 16 | Ht 65.5 in | Wt 199.0 lb

## 2017-05-19 DIAGNOSIS — N912 Amenorrhea, unspecified: Secondary | ICD-10-CM

## 2017-05-19 DIAGNOSIS — D259 Leiomyoma of uterus, unspecified: Secondary | ICD-10-CM

## 2017-05-19 LAB — POCT URINE PREGNANCY: Preg Test, Ur: NEGATIVE

## 2017-05-19 NOTE — Patient Instructions (Signed)
Please take the magnesium citrate bowel prep starting about 10:00 am the morning prior to the day of surgery.  Do not eat or drink anything after midnight the night before surgery.

## 2017-05-19 NOTE — Progress Notes (Signed)
GYNECOLOGY  VISIT   HPI: 36 y.o.   Single  African American  female   [name] [name] with Patient's last menstrual period was 03/11/2017 (exact date).   here for  Hysterectomy abdominal with salpingectomy.  Finance and parents will be there the day of surgery.  Has large uterine fibroids.  Currently on Megace for cycle control and not having menses.  Has periodic spotting.  Last time was on 3 days ago.  States she spots periodically.   Feeling ready to get rid of all the abdominal bulk and pressure. Has urinary frequency but not incontinence with the enlarged fibroids.   Rare use of Toradol.   Prior EMB 11/16 showed polypoid benign endometrium with Dr. Rogue Bussing, her prior GYN.  Declines future childbearing.   Recently diagnosed with adult onset diabetes and HgbA1c is down to 5.9 on 04/22/17 with Metformin.  LFTs were temporarily elevated, and have returned to normal.  Saw PCP and is cleared for surgery.   UPT negative.   GYNECOLOGIC HISTORY: Patient's last menstrual period was 03/11/2017 (exact date). Contraception:  condoms Menopausal hormone therapy:  n/a Last mammogram:  n/a Last pap smear:   02-19-17 neg HPV HR neg        OB History    Gravida Para Term Preterm AB Living   1 1 1     1    SAB TAB Ectopic Multiple Live Births                     Patient Active Problem List   Diagnosis Date Noted  . Hyperparathyroidism, primary ([place]) 10/28/2016  . Thyroid nodule 10/28/2016    Past Medical History:  Diagnosis Date  . Abnormal uterine bleeding   . Diabetes mellitus without complication ([place])   . Elevated hemoglobin A1c   . Elevated liver function tests 05/28/2016   Resolved July 2018.  Marland Kitchen GERD (gastroesophageal reflux disease)   . Hypercalcemia due to hyperthyroidism   . Hyperparathyroidism, primary [facility])    endocrinologist-  dr Loanne Drilling  . Hypertension   . Thyroid nodule    per endocrinoloigst note (dr ellision)  secondary to primary hyperparathyroidism  . Uterine  fibroid     Past Surgical History:  Procedure Laterality Date  . NO PAST SURGERIES      Current Outpatient Prescriptions  Medication Sig Dispense Refill  . amLODipine (NORVASC) 10 MG tablet Take 1 tablet by mouth every morning.   1  . ketorolac (TORADOL) 10 MG tablet Take 1 tablet (10 mg total) by mouth every 6 (six) hours as needed. 20 tablet 1  . losartan (COZAAR) 50 MG tablet Take 50 mg by mouth every morning. Patient takes 1/2 tablet daily  0  . megestrol (MEGACE) 40 MG tablet TAKE 1 TABLET (40 MG TOTAL) BY MOUTH 2 (TWO) TIMES DAILY. 40 tablet 0  . metFORMIN (GLUCOPHAGE-XR) 500 MG 24 hr tablet Take 1 tablet by mouth 2 (two) times daily.  0  . omeprazole (PRILOSEC) 20 MG capsule Take 20 mg by mouth as needed.    Marland Kitchen VITAMIN E HIGH POTENCY 400 units capsule Take 1 capsule by mouth daily.  11   No current facility-administered medications for this visit.      ALLERGIES: Patient has no known allergies.  Family History  Problem Relation Age of Onset  . Stroke Mother   . Hypertension Mother   . Breast cancer Sister 68       A & W.  Genetic testing negative.  . Diabetes  Paternal Grandmother   . Hypercalcemia Neg Hx     Social History   Social History  . Marital status: Single    Spouse name: N/A  . Number of children: N/A  . Years of education: N/A   Occupational History  . Not on file.   Social History Main Topics  . Smoking status: Never Smoker  . Smokeless tobacco: Never Used  . Alcohol use 1.2 oz/week    2 Standard drinks or equivalent per week     Comment: occasional  . Drug use: No  . Sexual activity: Yes    Partners: Male    Birth control/ protection: Condom   Other Topics Concern  . Not on file   Social History Narrative  . No narrative on file    ROS:  Pertinent items are noted in HPI.  PHYSICAL EXAMINATION:    BP 120/72   Pulse 70   Resp 16   Ht 5' 5.5" (1.664 m)   Wt 199 lb (90.3 kg)   LMP 03/11/2017 (Exact Date)   BMI 32.61 kg/m      General appearance: alert, cooperative and appears stated age Head: Normocephalic, without obvious abnormality, atraumatic Neck: no adenopathy, supple, symmetrical, trachea midline and thyroid with left nodule, nontender. Lungs: clear to auscultation bilaterally Breasts: normal appearance, no masses or tenderness, No nipple retraction or dimpling, No nipple discharge or bleeding, No axillary or supraclavicular adenopathy Heart: regular rate and rhythm Abdomen:  24 week size uterus.  Extremities: extremities normal, atraumatic, no cyanosis or edema Skin: Skin color, texture, turgor normal. No rashes or lesions Lymph nodes: Cervical, supraclavicular, and axillary nodes normal. No abnormal inguinal nodes palpated Neurologic: Grossly normal  Pelvic: External genitalia:  no lesions              Urethra:  normal appearing urethra with no masses, tenderness or lesions              Bartholins and Skenes: normal                 Vagina: normal appearing vagina with normal color and discharge, no lesions              Cervix:  Not visualized.  Large LUS fibroid making it difficult to feel the cervix as well.                 Bimanual Exam:  Uterus:  24 week size uterus that fills the pelvis.              Adnexa: no mass, fullness, tenderness                Chaperone was present for exam.  ASSESSMENT  Fibroids.  HTN.  Hyperparathyroidism.   Left thyroid enlargement.  Known. DM.  PLAN  Proceed with total abdominal hysterectomy with bilateral salpingectomy.  Risks, benefits, and alternatives reviewed with the patient who wishes to proceed. Continue Megace until surgery.  Has enough. Stop NSAIDs. Do Magnesium citrate bowel prep and then clear liquids the day prior to surgery.  Preop at hospital in 2 days.    An After Visit Summary was printed and given to the patient.

## 2017-05-20 NOTE — Patient Instructions (Signed)
Your procedure is scheduled on:  Monday, Aug. 27, 2018  Enter through the [employer] [facility] at:  6:00 AM  Pick up the phone at the desk and dial [phone].  Call this number if you have problems the morning of surgery: [phone].  Remember: Do NOT eat food or drink after:  Midnight Sunday  Take these medicines the morning of surgery with a SIP OF WATER:  Amlodipine, Losartan, Omeprazole  Do NOT take Metformin the evening before your surgery  Stop ALL herbal medications at this time  Do NOT smoke the day of surgery.  Do NOT wear jewelry (body piercing), metal hair clips/bobby pins, make-up, artifical eyelashes or nail polish. Do NOT wear lotions, powders, or perfumes.  You may wear deodorant. Do NOT shave for 48 hours prior to surgery. Do NOT bring valuables to the hospital. Contacts, dentures, or bridgework may not be worn into surgery.  Leave suitcase in car.  After surgery it may be brought to your room.  For patients admitted to the hospital, checkout time is 11:00 AM the day of discharge.  Bring a copy of your healthcare power of attorney and living will documents.

## 2017-05-21 ENCOUNTER — Encounter (HOSPITAL_COMMUNITY)
Admission: RE | Admit: 2017-05-21 | Discharge: 2017-05-21 | Disposition: A | Discharge status: Home / Self Care | Payer: 59 | Source: Ambulatory Visit | Attending: Obstetrics and Gynecology | Admitting: Obstetrics and Gynecology

## 2017-05-21 ENCOUNTER — Telehealth: Payer: Self-pay

## 2017-05-21 ENCOUNTER — Encounter (HOSPITAL_COMMUNITY): Payer: Self-pay

## 2017-05-21 DIAGNOSIS — D259 Leiomyoma of uterus, unspecified: Secondary | ICD-10-CM | POA: Diagnosis not present

## 2017-05-21 DIAGNOSIS — Z01812 Encounter for preprocedural laboratory examination: Secondary | ICD-10-CM | POA: Diagnosis not present

## 2017-05-21 HISTORY — DX: Fatty (change of) liver, not elsewhere classified: K76.0

## 2017-05-21 LAB — CBC
HEMATOCRIT: 37.3 % (ref 36.0–46.0)
Hemoglobin: 12 g/dL (ref 12.0–15.0)
MCH: 25.9 pg — ABNORMAL LOW (ref 26.0–34.0)
MCHC: 32.2 g/dL (ref 30.0–36.0)
MCV: 80.4 fL (ref 78.0–100.0)
PLATELETS: 372 10*3/uL (ref 150–400)
RBC: 4.64 MIL/uL (ref 3.87–5.11)
RDW: 14.8 % (ref 11.5–15.5)
WBC: 12.8 10*3/uL — AB (ref 4.0–10.5)

## 2017-05-21 LAB — COMPREHENSIVE METABOLIC PANEL
ALBUMIN: 4.8 g/dL (ref 3.5–5.0)
ALT: 25 U/L (ref 14–54)
AST: 21 U/L (ref 15–41)
Alkaline Phosphatase: 63 U/L (ref 38–126)
Anion gap: 10 (ref 5–15)
BILIRUBIN TOTAL: 1.2 mg/dL (ref 0.3–1.2)
BUN: 9 mg/dL (ref 6–20)
CHLORIDE: 107 mmol/L (ref 101–111)
CO2: 22 mmol/L (ref 22–32)
Calcium: 11.6 mg/dL — ABNORMAL HIGH (ref 8.9–10.3)
Creatinine, Ser: 0.93 mg/dL (ref 0.44–1.00)
GFR calc Af Amer: >60 mL/min (ref >60)
GFR calc non Af Amer: >60 mL/min (ref >60)
GLUCOSE: 101 mg/dL — AB (ref 65–99)
POTASSIUM: 3.5 mmol/L (ref 3.5–5.1)
SODIUM: 139 mmol/L (ref 135–145)
TOTAL PROTEIN: 9.2 g/dL — AB (ref 6.5–8.1)

## 2017-05-21 LAB — TYPE AND SCREEN
ABO/RH(D): O NEG
Antibody Screen: NEGATIVE

## 2017-05-21 LAB — ABO/RH: ABO/RH(D): O NEG

## 2017-05-21 NOTE — Telephone Encounter (Signed)
-----   Message from Nunzio Cobbs, MD sent at 05/21/2017  1:57 PM EDT ----- Please contact patient with her preop labs.  Her white blood cell count is slightly elevated.  I want to be sure she is feeling well and not having any fevers, respiratory, or bladder infection as we prepare for surgery.  Her glucose was slightly elevated and her calcium was high at 11.6.  These are both known issues for her.  Her hemoglobin is excellent.  Her blood type is O negative.

## 2017-05-21 NOTE — Pre-Procedure Instructions (Signed)
Discussed vital signs with Dr. Jenita Seashore.  Dr. Glennon Mac states Ms. Scheu needs to have good control of blood pressure and glucose as well as afebrile to avoid surgery cancellation.

## 2017-05-21 NOTE — Telephone Encounter (Signed)
Call to patient. Advised of instructions from Dr Quincy Simmonds. Patient states she rechecked temperature when she returned home and it was normal. She feels fine and no further temp. Advised monitor/observe and contact PCP if any fever or illness develops.   Encounter closed.

## 2017-05-21 NOTE — Telephone Encounter (Signed)
Spoke with patient and discussed preop labs with her per Dr.Silva's note below. Patient denies any respiratory or bladder symptoms. She states she is feeling well but at the hospital today the nurse did say her temperature was 100.9. Advised I would let Dr.Silva know and call her back. Routing to Dr.Silva

## 2017-05-21 NOTE — Telephone Encounter (Signed)
Please let patient know that if her temperature persists, she will need to see her PCP.   Cc- Lamont Snowball

## 2017-05-25 ENCOUNTER — Other Ambulatory Visit: Payer: Self-pay | Admitting: *Deleted

## 2017-05-25 NOTE — Patient Outreach (Signed)
[place] [facility]) Care Management  05/25/2017  CLAIRESSA [name] 1981/07/17 [id number]  Subjective: Telephone call to patient's home  / mobile number, no answer, left HIPAA compliant voicemail message, and requested call back.   Objective: Per chart review, patient to be admitted on 06/01/17 for HYSTERECTOMY ABDOMINAL WITH SALPINGECTOMY at [facility].   Assessment:   Received UMR Preoperative follow up call referral on 05/21/17.  Preoperative call follow up pending patient contact.    Plan: RNCM will call patient for 2nd telephone outreach attempt, preoperative call follow up, within 10 business days if no return call.    [name] [name] H. Annia Friendly, BSN, [place] Management [facility] Telephonic CM Phone: [phone] Fax: [phone]

## 2017-05-26 ENCOUNTER — Other Ambulatory Visit: Payer: Self-pay | Admitting: *Deleted

## 2017-05-26 NOTE — Patient Outreach (Signed)
[place] [facility]) Care Management  05/26/2017  GENESI STEFANKO 04-02-1981 [id number]   Subjective: Telephone call to patient's home  / mobile number, no answer, left HIPAA compliant voicemail message, and requested call back.   Objective: Per chart review, patient to be admitted on 06/01/17 for HYSTERECTOMY ABDOMINAL WITH SALPINGECTOMY at [facility].   Assessment:   Received UMR Preoperative follow up call referral on 05/21/17.  Preoperative call follow up pending patient contact.    Plan: RNCM will call patient for 3rd telephone outreach attempt, preoperative call follow up, within 10 business days if no return call.    Tycen Dockter H. Annia Friendly, BSN, [place] Management [facility] Telephonic CM Phone: [phone] Fax: [phone]

## 2017-05-27 ENCOUNTER — Other Ambulatory Visit: Payer: Self-pay | Admitting: *Deleted

## 2017-05-27 NOTE — Patient Outreach (Addendum)
[facility]) Care Management  05/27/2017  [name] [name] 22-Aug-1981 [id number]  Subjective: Received voicemail message from [name] [name], states she works night shift, best time to call is in the afternoon between 3:30pm - 5:00pm, and requested call back.  Telephone call to patient's home / mobile number, spoke with patient, and HIPAA verified.  Discussed [facility] Care Management UMR Transition of care follow up, preoperative call follow up, patient voiced understanding, and is in agreement to both types of follow up.  Patient states she is doing fine, ready for surgery on 06/01/17 at [facility] hospital,  [place], and family will be assisting her post discharge as needed. Patient voices understanding of medical diagnosis and treatment plan.  States she is accessing the following [facility] benefits: outpatient pharmacy, hospital indemnity (verbally given contact number for [facility] [employer] to obtain itemized bill [phone]), and has been approved for family medical leave act (FMLA). Patient states she does not have any preoperative questions, care coordination, disease management, disease monitoring, transportation, community resource, or pharmacy needs at this time. States he is very appreciative of the follow up and is in agreement to receive [place] Management information post transition of care follow up.    Objective:Per chart review, patient to be admitted on 06/01/17 for HYSTERECTOMY ABDOMINAL WITH SALPINGECTOMY at [facility].   Assessment: Received UMR Preoperative follow up call referral on 05/21/17. Preoperative call completed, and transition of care follow up pending notification of patient discharge.   Plan: RNCM will call patient for  telephone outreach attempt, transition of care follow up, within 3 business days of hospital discharge notification.     [name] [name], BSN, [place] Management [facility] Telephonic CM Phone: [phone] Fax:  [phone]

## 2017-05-28 ENCOUNTER — Ambulatory Visit: Payer: Self-pay | Admitting: *Deleted

## 2017-05-31 NOTE — Anesthesia Preprocedure Evaluation (Addendum)
Anesthesia Evaluation  Patient identified by MRN, date of birth, ID band Patient awake    Reviewed: Allergy & Precautions, H&P , NPO status , Patient's Chart, lab work & pertinent test results, reviewed documented beta blocker date and time   Airway Mallampati: II  TM Distance: >3 FB Neck ROM: full    Dental no notable dental hx. (+) Dental Advisory Given   Pulmonary neg pulmonary ROS,    Pulmonary exam normal breath sounds clear to auscultation       Cardiovascular Exercise Tolerance: Good hypertension, Pt. on medications negative cardio ROS   Rhythm:regular Rate:Normal     Neuro/Psych negative neurological ROS  negative psych ROS   GI/Hepatic negative GI ROS, Neg liver ROS, GERD  ,  Endo/Other  negative endocrine ROSdiabetes, Oral Hypoglycemic Agents  Renal/GU negative Renal ROS  negative genitourinary   Musculoskeletal   Abdominal   Peds  Hematology negative hematology ROS (+) anemia ,   Anesthesia Other Findings   Reproductive/Obstetrics negative OB ROS                            Anesthesia Physical Anesthesia Plan  ASA: II  Anesthesia Plan: General   Post-op Pain Management:    Induction: Intravenous  PONV Risk Score and Plan: 4 or greater and Ondansetron, Dexamethasone, Scopolamine patch - Pre-op and Diphenhydramine  Airway Management Planned: LMA and Oral ETT  Additional Equipment:   Intra-op Plan:   Post-operative Plan: Extubation in OR  Informed Consent: I have reviewed the patients History and Physical, chart, labs and discussed the procedure including the risks, benefits and alternatives for the proposed anesthesia with the patient or authorized representative who has indicated his/her understanding and acceptance.   Dental Advisory Given  Plan Discussed with: CRNA and Anesthesiologist  Anesthesia Plan Comments: (Pt has noted tachycardia, but reports anxiety  related to surgery.  Bp good, ECG shows sinus tach. Denies cardiac symptoms.  Suspect autonomic dysfunction related to DM. Will administer Beta-blocker and fluids. )     Anesthesia Quick Evaluation

## 2017-06-01 ENCOUNTER — Encounter (HOSPITAL_COMMUNITY)
Admission: RE | Disposition: A | Discharge status: Home / Self Care | Payer: Self-pay | Source: Ambulatory Visit | Attending: Obstetrics and Gynecology

## 2017-06-01 ENCOUNTER — Inpatient Hospital Stay (HOSPITAL_COMMUNITY): Payer: 59 | Admitting: Anesthesiology

## 2017-06-01 ENCOUNTER — Encounter (HOSPITAL_COMMUNITY): Payer: Self-pay | Admitting: *Deleted

## 2017-06-01 ENCOUNTER — Inpatient Hospital Stay (HOSPITAL_COMMUNITY)
Admission: RE | Admit: 2017-06-01 | Discharge: 2017-06-03 | DRG: 743 | Disposition: A | Discharge status: Home / Self Care | Payer: 59 | Source: Ambulatory Visit | Attending: Obstetrics and Gynecology | Admitting: Obstetrics and Gynecology

## 2017-06-01 DIAGNOSIS — I10 Essential (primary) hypertension: Secondary | ICD-10-CM | POA: Diagnosis present

## 2017-06-01 DIAGNOSIS — R350 Frequency of micturition: Secondary | ICD-10-CM | POA: Diagnosis present

## 2017-06-01 DIAGNOSIS — D259 Leiomyoma of uterus, unspecified: Principal | ICD-10-CM | POA: Diagnosis present

## 2017-06-01 DIAGNOSIS — E049 Nontoxic goiter, unspecified: Secondary | ICD-10-CM | POA: Diagnosis present

## 2017-06-01 DIAGNOSIS — K219 Gastro-esophageal reflux disease without esophagitis: Secondary | ICD-10-CM | POA: Diagnosis present

## 2017-06-01 DIAGNOSIS — N800 Endometriosis of uterus: Secondary | ICD-10-CM | POA: Diagnosis not present

## 2017-06-01 DIAGNOSIS — N840 Polyp of corpus uteri: Secondary | ICD-10-CM | POA: Diagnosis present

## 2017-06-01 DIAGNOSIS — Z90710 Acquired absence of both cervix and uterus: Secondary | ICD-10-CM | POA: Diagnosis present

## 2017-06-01 DIAGNOSIS — E210 Primary hyperparathyroidism: Secondary | ICD-10-CM | POA: Diagnosis present

## 2017-06-01 DIAGNOSIS — E119 Type 2 diabetes mellitus without complications: Secondary | ICD-10-CM | POA: Diagnosis present

## 2017-06-01 HISTORY — PX: HYSTERECTOMY ABDOMINAL WITH SALPINGECTOMY: SHX6725

## 2017-06-01 LAB — GLUCOSE, CAPILLARY
GLUCOSE-CAPILLARY: 97 mg/dL (ref 65–99)
GLUCOSE-CAPILLARY: 125 mg/dL — AB (ref 65–99)
GLUCOSE-CAPILLARY: 165 mg/dL — AB (ref 65–99)
Glucose-Capillary: 181 mg/dL — ABNORMAL HIGH (ref 65–99)

## 2017-06-01 LAB — PREGNANCY, URINE: Preg Test, Ur: NEGATIVE

## 2017-06-01 SURGERY — HYSTERECTOMY, TOTAL, ABDOMINAL, WITH SALPINGECTOMY
Anesthesia: General | Site: Abdomen | Laterality: Bilateral

## 2017-06-01 MED ORDER — FENTANYL CITRATE (PF) 100 MCG/2ML IJ SOLN
INTRAMUSCULAR | Status: DC | PRN
  Administered 2017-06-01: 100 ug via INTRAVENOUS
  Administered 2017-06-01 (×3): 50 ug via INTRAVENOUS

## 2017-06-01 MED ORDER — LACTATED RINGERS IV SOLN
INTRAVENOUS | Status: DC
  Administered 2017-06-01: 16:00:00 via INTRAVENOUS

## 2017-06-01 MED ORDER — DEXAMETHASONE SODIUM PHOSPHATE 4 MG/ML IJ SOLN
INTRAMUSCULAR | Status: AC
  Filled 2017-06-01: qty 1

## 2017-06-01 MED ORDER — KETOROLAC TROMETHAMINE 30 MG/ML IJ SOLN
INTRAMUSCULAR | Status: DC | PRN
  Administered 2017-06-01: 30 mg via INTRAVENOUS

## 2017-06-01 MED ORDER — SUGAMMADEX SODIUM 200 MG/2ML IV SOLN
INTRAVENOUS | Status: AC
  Filled 2017-06-01: qty 4

## 2017-06-01 MED ORDER — ROCURONIUM BROMIDE 100 MG/10ML IV SOLN
INTRAVENOUS | Status: DC | PRN
  Administered 2017-06-01: 50 mg via INTRAVENOUS
  Administered 2017-06-01: 5 mg via INTRAVENOUS

## 2017-06-01 MED ORDER — NALOXONE HCL 0.4 MG/ML IJ SOLN: 0.4000 mg | INTRAMUSCULAR | Status: DC | PRN

## 2017-06-01 MED ORDER — ONDANSETRON HCL 4 MG/2ML IJ SOLN: 4.0000 mg | Freq: Once | INTRAMUSCULAR | Status: DC | PRN

## 2017-06-01 MED ORDER — CEFOTETAN DISODIUM-DEXTROSE 2-2.08 GM-% IV SOLR
2.0000 g | INTRAVENOUS | Status: AC
  Administered 2017-06-01: 2 g via INTRAVENOUS

## 2017-06-01 MED ORDER — FENTANYL CITRATE (PF) 100 MCG/2ML IJ SOLN
INTRAMUSCULAR | Status: AC
  Filled 2017-06-01: qty 2

## 2017-06-01 MED ORDER — ONDANSETRON HCL 4 MG/2ML IJ SOLN
INTRAMUSCULAR | Status: DC | PRN
  Administered 2017-06-01: 4 mg via INTRAVENOUS

## 2017-06-01 MED ORDER — ESMOLOL HCL 100 MG/10ML IV SOLN
INTRAVENOUS | Status: AC
  Filled 2017-06-01: qty 10

## 2017-06-01 MED ORDER — BUPIVACAINE HCL (PF) 0.25 % IJ SOLN
INTRAMUSCULAR | Status: AC
  Filled 2017-06-01: qty 30

## 2017-06-01 MED ORDER — INSULIN ASPART 100 UNIT/ML ~~LOC~~ SOLN
0.0000 [IU] | SUBCUTANEOUS | Status: DC
  Administered 2017-06-01: 4 [IU] via SUBCUTANEOUS

## 2017-06-01 MED ORDER — MORPHINE SULFATE 2 MG/ML IV SOLN
INTRAVENOUS | Status: DC
  Administered 2017-06-01: 4 mg via INTRAVENOUS
  Administered 2017-06-01: 13:00:00 via INTRAVENOUS
  Filled 2017-06-01: qty 30

## 2017-06-01 MED ORDER — LACTATED RINGERS IV SOLN: INTRAVENOUS | Status: DC

## 2017-06-01 MED ORDER — FENTANYL CITRATE (PF) 250 MCG/5ML IJ SOLN
INTRAMUSCULAR | Status: AC
  Filled 2017-06-01: qty 5

## 2017-06-01 MED ORDER — ALBUMIN HUMAN 5 % IV SOLN
INTRAVENOUS | Status: AC
  Filled 2017-06-01: qty 250

## 2017-06-01 MED ORDER — ONDANSETRON HCL 4 MG/2ML IJ SOLN: 4.0000 mg | Freq: Four times a day (QID) | INTRAMUSCULAR | Status: DC | PRN

## 2017-06-01 MED ORDER — LACTATED RINGERS IV SOLN
INTRAVENOUS | Status: DC
  Administered 2017-06-01 (×2): via INTRAVENOUS

## 2017-06-01 MED ORDER — METFORMIN HCL 500 MG PO TABS
500.0000 mg | ORAL_TABLET | Freq: Two times a day (BID) | ORAL | Status: DC
  Administered 2017-06-02 (×2): 500 mg via ORAL
  Filled 2017-06-01 (×4): qty 1

## 2017-06-01 MED ORDER — METOPROLOL TARTRATE 5 MG/5ML IV SOLN
10.0000 mg | Freq: Once | INTRAVENOUS | Status: DC
  Filled 2017-06-01: qty 10

## 2017-06-01 MED ORDER — PROPOFOL 10 MG/ML IV BOLUS
INTRAVENOUS | Status: DC | PRN
  Administered 2017-06-01: 180 mg via INTRAVENOUS

## 2017-06-01 MED ORDER — SUGAMMADEX SODIUM 200 MG/2ML IV SOLN
INTRAVENOUS | Status: DC | PRN
  Administered 2017-06-01: 200 mg via INTRAVENOUS
  Administered 2017-06-01: 100 mg via INTRAVENOUS

## 2017-06-01 MED ORDER — AMLODIPINE BESYLATE 10 MG PO TABS: 10.0000 mg | ORAL_TABLET | Freq: Every day | ORAL | Status: DC

## 2017-06-01 MED ORDER — METOPROLOL TARTRATE 5 MG/5ML IV SOLN
INTRAVENOUS | Status: DC | PRN
  Administered 2017-06-01: 1 mg via INTRAVENOUS

## 2017-06-01 MED ORDER — MIDAZOLAM HCL 2 MG/2ML IJ SOLN
INTRAMUSCULAR | Status: AC
  Filled 2017-06-01: qty 2

## 2017-06-01 MED ORDER — IBUPROFEN 800 MG PO TABS
800.0000 mg | ORAL_TABLET | Freq: Three times a day (TID) | ORAL | Status: DC | PRN
  Administered 2017-06-01 – 2017-06-03 (×4): 800 mg via ORAL
  Filled 2017-06-01 (×4): qty 1

## 2017-06-01 MED ORDER — MIDAZOLAM HCL 2 MG/2ML IJ SOLN
INTRAMUSCULAR | Status: DC | PRN
  Administered 2017-06-01: 2 mg via INTRAVENOUS

## 2017-06-01 MED ORDER — SCOPOLAMINE 1 MG/3DAYS TD PT72
MEDICATED_PATCH | TRANSDERMAL | Status: AC
  Administered 2017-06-01: 1.5 mg via TRANSDERMAL
  Filled 2017-06-01: qty 1

## 2017-06-01 MED ORDER — MENTHOL 3 MG MT LOZG: 1.0000 | LOZENGE | OROMUCOSAL | Status: DC | PRN

## 2017-06-01 MED ORDER — PROPRANOLOL HCL 1 MG/ML IV SOLN
INTRAVENOUS | Status: DC | PRN
  Administered 2017-06-01: 1 mg via INTRAVENOUS

## 2017-06-01 MED ORDER — SCOPOLAMINE 1 MG/3DAYS TD PT72
1.0000 | MEDICATED_PATCH | Freq: Once | TRANSDERMAL | Status: DC
  Administered 2017-06-01: 1.5 mg via TRANSDERMAL

## 2017-06-01 MED ORDER — SODIUM CHLORIDE 0.9% FLUSH: 9.0000 mL | INTRAVENOUS | Status: DC | PRN

## 2017-06-01 MED ORDER — ROCURONIUM BROMIDE 100 MG/10ML IV SOLN
INTRAVENOUS | Status: AC
  Filled 2017-06-01: qty 1

## 2017-06-01 MED ORDER — KETOROLAC TROMETHAMINE 30 MG/ML IJ SOLN
INTRAMUSCULAR | Status: AC
  Filled 2017-06-01: qty 1

## 2017-06-01 MED ORDER — LOSARTAN POTASSIUM 25 MG PO TABS
25.0000 mg | ORAL_TABLET | Freq: Every day | ORAL | Status: DC
  Filled 2017-06-01: qty 1

## 2017-06-01 MED ORDER — OXYCODONE-ACETAMINOPHEN 5-325 MG PO TABS
1.0000 | ORAL_TABLET | ORAL | Status: DC | PRN
  Administered 2017-06-02: 2 via ORAL
  Administered 2017-06-02: 1 via ORAL
  Administered 2017-06-02 – 2017-06-03 (×2): 2 via ORAL
  Administered 2017-06-03: 1 via ORAL
  Filled 2017-06-01 (×2): qty 2
  Filled 2017-06-01 (×2): qty 1
  Filled 2017-06-01: qty 2

## 2017-06-01 MED ORDER — PANTOPRAZOLE SODIUM 40 MG PO TBEC
40.0000 mg | DELAYED_RELEASE_TABLET | Freq: Every day | ORAL | Status: DC
  Administered 2017-06-01: 40 mg via ORAL
  Filled 2017-06-01: qty 1

## 2017-06-01 MED ORDER — FENTANYL CITRATE (PF) 100 MCG/2ML IJ SOLN
25.0000 ug | INTRAMUSCULAR | Status: DC | PRN
  Administered 2017-06-01 (×4): 50 ug via INTRAVENOUS

## 2017-06-01 MED ORDER — DIPHENHYDRAMINE HCL 50 MG/ML IJ SOLN
12.5000 mg | Freq: Four times a day (QID) | INTRAMUSCULAR | Status: DC | PRN
  Administered 2017-06-01: 12.5 mg via INTRAVENOUS
  Filled 2017-06-01: qty 1

## 2017-06-01 MED ORDER — ENOXAPARIN SODIUM 40 MG/0.4ML ~~LOC~~ SOLN
40.0000 mg | SUBCUTANEOUS | Status: AC
  Administered 2017-06-01: 40 mg via SUBCUTANEOUS
  Filled 2017-06-01: qty 0.4

## 2017-06-01 MED ORDER — MEPERIDINE HCL 25 MG/ML IJ SOLN: 6.2500 mg | INTRAMUSCULAR | Status: DC | PRN

## 2017-06-01 MED ORDER — LIDOCAINE HCL (CARDIAC) 20 MG/ML IV SOLN
INTRAVENOUS | Status: DC | PRN
  Administered 2017-06-01: 40 mg via INTRAVENOUS

## 2017-06-01 MED ORDER — PROPOFOL 10 MG/ML IV BOLUS
INTRAVENOUS | Status: AC
  Filled 2017-06-01: qty 20

## 2017-06-01 MED ORDER — ONDANSETRON HCL 4 MG/2ML IJ SOLN
INTRAMUSCULAR | Status: AC
  Filled 2017-06-01: qty 2

## 2017-06-01 MED ORDER — PHENYLEPHRINE HCL 10 MG/ML IJ SOLN
INTRAMUSCULAR | Status: DC | PRN
  Administered 2017-06-01 (×3): 40 ug via INTRAVENOUS

## 2017-06-01 MED ORDER — SIMETHICONE 80 MG PO CHEW
80.0000 mg | CHEWABLE_TABLET | Freq: Four times a day (QID) | ORAL | Status: DC | PRN
  Administered 2017-06-02: 80 mg via ORAL
  Filled 2017-06-01: qty 1

## 2017-06-01 MED ORDER — LIDOCAINE HCL (CARDIAC) 20 MG/ML IV SOLN
INTRAVENOUS | Status: AC
  Filled 2017-06-01: qty 5

## 2017-06-01 MED ORDER — KETOROLAC TROMETHAMINE 30 MG/ML IJ SOLN
30.0000 mg | Freq: Four times a day (QID) | INTRAMUSCULAR | Status: AC
  Administered 2017-06-01 – 2017-06-02 (×2): 30 mg via INTRAVENOUS
  Filled 2017-06-01 (×2): qty 1

## 2017-06-01 MED ORDER — ALBUMIN HUMAN 5 % IV SOLN
INTRAVENOUS | Status: DC | PRN
  Administered 2017-06-01: 08:00:00 via INTRAVENOUS

## 2017-06-01 MED ORDER — ONDANSETRON HCL 4 MG PO TABS: 4.0000 mg | ORAL_TABLET | Freq: Four times a day (QID) | ORAL | Status: DC | PRN

## 2017-06-01 MED ORDER — CEFOTETAN DISODIUM-DEXTROSE 2-2.08 GM-% IV SOLR
INTRAVENOUS | Status: AC
  Filled 2017-06-01: qty 50

## 2017-06-01 MED ORDER — DEXAMETHASONE SODIUM PHOSPHATE 10 MG/ML IJ SOLN
INTRAMUSCULAR | Status: DC | PRN
  Administered 2017-06-01: 10 mg via INTRAVENOUS

## 2017-06-01 MED ORDER — DIPHENHYDRAMINE HCL 12.5 MG/5ML PO ELIX: 12.5000 mg | ORAL_SOLUTION | Freq: Four times a day (QID) | ORAL | Status: DC | PRN

## 2017-06-01 MED ORDER — PROPRANOLOL HCL 1 MG/ML IV SOLN
INTRAVENOUS | Status: AC
  Filled 2017-06-01: qty 1

## 2017-06-01 MED ORDER — ESMOLOL HCL 100 MG/10ML IV SOLN
INTRAVENOUS | Status: DC | PRN
  Administered 2017-06-01 (×2): 20 mg via INTRAVENOUS
  Administered 2017-06-01: 40 mg via INTRAVENOUS

## 2017-06-01 SURGICAL SUPPLY — 50 items
ADH SKN CLS APL DERMABOND .7 (GAUZE/BANDAGES/DRESSINGS) ×2
CANISTER SUCT 3000ML PPV (MISCELLANEOUS) ×4 IMPLANT
CATH FOLEY 3WAY  5CC 16FR (CATHETERS)
CATH FOLEY 3WAY 5CC 16FR (CATHETERS) IMPLANT
CELLS DAT CNTRL 66122 CELL SVR (MISCELLANEOUS) IMPLANT
CLOTH BEACON ORANGE TIMEOUT ST (SAFETY) ×4 IMPLANT
DECANTER SPIKE VIAL GLASS SM (MISCELLANEOUS) IMPLANT
DERMABOND ADVANCED (GAUZE/BANDAGES/DRESSINGS) ×2
DERMABOND ADVANCED .7 DNX12 (GAUZE/BANDAGES/DRESSINGS) ×1 IMPLANT
DRAPE UNDER BUTCK 40X44W/POUCH (DRAPES) ×3 IMPLANT
DRAPE WARM FLUID 44X44 (DRAPE) ×3 IMPLANT
DRSG OPSITE POSTOP 4X10 (GAUZE/BANDAGES/DRESSINGS) ×4 IMPLANT
DURAPREP 26ML APPLICATOR (WOUND CARE) ×4 IMPLANT
GAUZE SPONGE 4X4 16PLY XRAY LF (GAUZE/BANDAGES/DRESSINGS) ×3 IMPLANT
GLOVE BIO SURGEON STRL SZ 6.5 (GLOVE) ×3 IMPLANT
GLOVE BIO SURGEONS STRL SZ 6.5 (GLOVE) ×1
GLOVE BIOGEL PI IND STRL 7.0 (GLOVE) ×6 IMPLANT
GLOVE BIOGEL PI INDICATOR 7.0 (GLOVE) ×6
GOWN STRL REUS W/TWL LRG LVL3 (GOWN DISPOSABLE) ×12 IMPLANT
HEMOSTAT ARISTA ABSORB 3G PWDR (MISCELLANEOUS) ×3 IMPLANT
LEGGING LITHOTOMY PAIR STRL (DRAPES) ×3 IMPLANT
NEEDLE HYPO 22GX1.5 SAFETY (NEEDLE) ×4 IMPLANT
NS IRRIG 1000ML POUR BTL (IV SOLUTION) ×4 IMPLANT
PACK ABDOMINAL GYN (CUSTOM PROCEDURE TRAY) ×4 IMPLANT
PACK TRENDGUARD 450 HYBRID PRO (MISCELLANEOUS) ×2 IMPLANT
PAD OB MATERNITY 4.3X12.25 (PERSONAL CARE ITEMS) ×4 IMPLANT
PROTECTOR NERVE ULNAR (MISCELLANEOUS) ×8 IMPLANT
RETRACTOR WND ALEXIS 18 MED (MISCELLANEOUS) IMPLANT
RETRACTOR WND ALEXIS 25 LRG (MISCELLANEOUS) IMPLANT
RTRCTR WOUND ALEXIS 18CM MED (MISCELLANEOUS)
RTRCTR WOUND ALEXIS 25CM LRG (MISCELLANEOUS)
SET CYSTO W/LG BORE CLAMP LF (SET/KITS/TRAYS/PACK) ×1 IMPLANT
SHEET LAVH (DRAPES) ×4 IMPLANT
SPONGE LAP 18X18 X RAY DECT (DISPOSABLE) ×2 IMPLANT
SUT PLAIN 2 0 XLH (SUTURE) ×3 IMPLANT
SUT VIC AB 0 CT1 18XCR BRD8 (SUTURE) ×5 IMPLANT
SUT VIC AB 0 CT1 27 (SUTURE) ×12
SUT VIC AB 0 CT1 27XBRD ANBCTR (SUTURE) ×6 IMPLANT
SUT VIC AB 0 CT1 8-18 (SUTURE) ×12
SUT VIC AB 2-0 CT1 27 (SUTURE) ×4
SUT VIC AB 2-0 CT1 TAPERPNT 27 (SUTURE) ×2 IMPLANT
SUT VIC AB 2-0 SH 27 (SUTURE) ×4
SUT VIC AB 2-0 SH 27XBRD (SUTURE) ×2 IMPLANT
SUT VIC AB 4-0 PS2 27 (SUTURE) ×4 IMPLANT
SUT VICRYL 0 TIES 12 18 (SUTURE) ×4 IMPLANT
SYR CONTROL 10ML LL (SYRINGE) ×3 IMPLANT
TOWEL OR 17X24 6PK STRL BLUE (TOWEL DISPOSABLE) ×8 IMPLANT
TRAY FOLEY CATH SILVER 14FR (SET/KITS/TRAYS/PACK) IMPLANT
TRAY FOLEY CATH SILVER 16FR (SET/KITS/TRAYS/PACK) ×4 IMPLANT
TRENDGUARD 450 HYBRID PRO PACK (MISCELLANEOUS) ×4

## 2017-06-01 NOTE — Brief Op Note (Signed)
06/01/2017  9:54 AM  PATIENT:  [name] [name]  36 y.o. female  PRE-OPERATIVE DIAGNOSIS:  Uterine fibroids  POST-OPERATIVE DIAGNOSIS:  Uterine fibroids  PROCEDURE:  Procedure(s): HYSTERECTOMY ABDOMINAL WITH Bilateral SALPINGECTOMY (Bilateral)  SURGEON:  Surgeon(s) and Role:    * Amundson Raliegh Ip, MD - Primary    * Talbert Nan, Francesca Jewett, MD - Assisting  PHYSICIAN ASSISTANT: NA  ASSISTANTS: Dorothy Spark, MD   ANESTHESIA:   general  EBL:  Total I/O In: 7824 [I.V.:1500; IV Piggyback:250] Out: 550 [Urine:400; Blood:150]  Albumin 250 cc IV.  BLOOD ADMINISTERED:none  DRAINS: Urinary Catheter (Foley)   LOCAL MEDICATIONS USED:  NONE  SPECIMEN:  Source of Specimen:  uterus, cervix, bilateral tubes.  DISPOSITION OF SPECIMEN:  PATHOLOGY  COUNTS:  YES  TOURNIQUET:  * No tourniquets in log *  DICTATION: .Other Dictation: Dictation Number    PLAN OF CARE: Admit to inpatient   PATIENT DISPOSITION:  PACU - hemodynamically stable.   Delay start of Pharmacological VTE agent (>24hrs) due to surgical blood loss or risk of bleeding: not applicable

## 2017-06-01 NOTE — Progress Notes (Signed)
Update to History and Physical  No marked change in status since office preop visit.  Patient examined.  Ok to proceed with surgery.  

## 2017-06-01 NOTE — H&P (Signed)
Office Visit   05/19/2017 [place] Silva, Everardo All, MD  Obstetrics and Gynecology   Amenorrhea +1 more  Dx   consult ; Referred by Marda Stalker, PA-C  Reason for Visit   Additional Documentation   Vitals:   BP 120/72   Pulse 70   Resp 16   Ht 5' 5.5" (1.664 m)   Wt 199 lb (90.3 kg)   LMP 03/11/2017 (Exact Date)   BMI 32.61 kg/m   BSA 2.04 m      More Vitals   Flowsheets:   Custom Formula Data,   MEWS Score,   Anthropometrics,   Infectious Disease Screening     Encounter Info:   Billing Info,   History,   Allergies,   Detailed Report     All Notes   Progress Notes by Nunzio Cobbs, MD at 05/19/2017 8:15 AM   Author: Nunzio Cobbs, MD Author Type: Physician Filed: 05/19/2017 8:58 AM  Note Status: Signed Cosign: Cosign Not Required Encounter Date: 05/19/2017  Editor: Nunzio Cobbs, MD (Physician)  Prior Versions: [place], [place], CMA (Certified Medical Assistant) at 05/19/2017 8:26 AM - Sign at close encounter    GYNECOLOGY  VISIT   HPI: 36 y.o.   Single  African American  female   [id number] with Patient's last menstrual period was 03/11/2017 (exact date).   here for  Hysterectomy abdominal with salpingectomy.  Finance and parents will be there the day of surgery.  Has large uterine fibroids.  Currently on Megace for cycle control and not having menses.  Has periodic spotting.  Last time was on 3 days ago.  States she spots periodically.   Feeling ready to get rid of all the abdominal bulk and pressure. Has urinary frequency but not incontinence with the enlarged fibroids.   Rare use of Toradol.   Prior EMB 11/16 showed polypoid benign endometrium with Dr. Rogue Bussing, her prior GYN.  Declines future childbearing.   Recently diagnosed with adult onset diabetes and HgbA1c is down to 5.9 on 04/22/17 with Metformin.  LFTs were temporarily elevated, and have returned to normal.    Saw PCP and is cleared for surgery.   UPT negative.   GYNECOLOGIC HISTORY: Patient's last menstrual period was 03/11/2017 (exact date). Contraception:  condoms Menopausal hormone therapy:  n/a Last mammogram:  n/a Last pap smear:   02-19-17 neg HPV HR neg                OB History    Gravida Para Term Preterm AB Living   1 1 1     1    SAB TAB Ectopic Multiple Live Births                         Patient Active Problem List   Diagnosis Date Noted  . Hyperparathyroidism, primary ([place]) 10/28/2016  . Thyroid nodule 10/28/2016        Past Medical History:  Diagnosis Date  . Abnormal uterine bleeding   . Diabetes mellitus without complication ([place])   . Elevated hemoglobin A1c   . Elevated liver function tests 05/28/2016   Resolved July 2018.  Marland Kitchen GERD (gastroesophageal reflux disease)   . Hypercalcemia due to hyperthyroidism   . Hyperparathyroidism, primary [facility])    endocrinologist-  dr Loanne Drilling  . Hypertension   . Thyroid nodule    per endocrinoloigst note (dr ellision)  secondary to  primary hyperparathyroidism  . Uterine fibroid          Past Surgical History:  Procedure Laterality Date  . NO PAST SURGERIES            Current Outpatient Prescriptions  Medication Sig Dispense Refill  . amLODipine (NORVASC) 10 MG tablet Take 1 tablet by mouth every morning.   1  . ketorolac (TORADOL) 10 MG tablet Take 1 tablet (10 mg total) by mouth every 6 (six) hours as needed. 20 tablet 1  . losartan (COZAAR) 50 MG tablet Take 50 mg by mouth every morning. Patient takes 1/2 tablet daily  0  . megestrol (MEGACE) 40 MG tablet TAKE 1 TABLET (40 MG TOTAL) BY MOUTH 2 (TWO) TIMES DAILY. 40 tablet 0  . metFORMIN (GLUCOPHAGE-XR) 500 MG 24 hr tablet Take 1 tablet by mouth 2 (two) times daily.  0  . omeprazole (PRILOSEC) 20 MG capsule Take 20 mg by mouth as needed.    Marland Kitchen VITAMIN E HIGH POTENCY 400 units capsule Take 1 capsule by mouth daily.  11   No  current facility-administered medications for this visit.      ALLERGIES: Patient has no known allergies.       Family History  Problem Relation Age of Onset  . Stroke Mother   . Hypertension Mother   . Breast cancer Sister 39       A & W.  Genetic testing negative.  . Diabetes Paternal Grandmother   . Hypercalcemia Neg Hx     Social History        Social History  . Marital status: Single    Spouse name: N/A  . Number of children: N/A  . Years of education: N/A      Occupational History  . Not on file.         Social History Main Topics  . Smoking status: Never Smoker  . Smokeless tobacco: Never Used  . Alcohol use 1.2 oz/week    2 Standard drinks or equivalent per week     Comment: occasional  . Drug use: No  . Sexual activity: Yes    Partners: Male    Birth control/ protection: Condom       Other Topics Concern  . Not on file      Social History Narrative  . No narrative on file    ROS:  Pertinent items are noted in HPI.  PHYSICAL EXAMINATION:    BP 120/72   Pulse 70   Resp 16   Ht 5' 5.5" (1.664 m)   Wt 199 lb (90.3 kg)   LMP 03/11/2017 (Exact Date)   BMI 32.61 kg/m     General appearance: alert, cooperative and appears stated age Head: Normocephalic, without obvious abnormality, atraumatic Neck: no adenopathy, supple, symmetrical, trachea midline and thyroid with left nodule, nontender. Lungs: clear to auscultation bilaterally Breasts: normal appearance, no masses or tenderness, No nipple retraction or dimpling, No nipple discharge or bleeding, No axillary or supraclavicular adenopathy Heart: regular rate and rhythm Abdomen:  24 week size uterus.  Extremities: extremities normal, atraumatic, no cyanosis or edema Skin: Skin color, texture, turgor normal. No rashes or lesions Lymph nodes: Cervical, supraclavicular, and axillary nodes normal. No abnormal inguinal nodes palpated Neurologic: Grossly  normal  Pelvic: External genitalia:  no lesions              Urethra:  normal appearing urethra with no masses, tenderness or lesions  Bartholins and Skenes: normal                 Vagina: normal appearing vagina with normal color and discharge, no lesions              Cervix:  Not visualized.  Large LUS fibroid making it difficult to feel the cervix as well.                 Bimanual Exam:  Uterus:  24 week size uterus that fills the pelvis.              Adnexa: no mass, fullness, tenderness                Chaperone was present for exam.  ASSESSMENT  Fibroids.  HTN.  Hyperparathyroidism.   Left thyroid enlargement.  Known. DM.  PLAN  Proceed with total abdominal hysterectomy with bilateral salpingectomy.  Risks, benefits, and alternatives reviewed with the patient who wishes to proceed. Continue Megace until surgery.  Has enough. Stop NSAIDs. Do Magnesium citrate bowel prep and then clear liquids the day prior to surgery.  Preop at hospital in 2 days.    An After Visit Summary was printed and given to the patient.

## 2017-06-01 NOTE — Progress Notes (Signed)
Day of Surgery Procedure(s) (LRB): HYSTERECTOMY ABDOMINAL WITH Bilateral SALPINGECTOMY (Bilateral)  Subjective: Patient reports cramping and gas pain.  Received Motrin orally.  Wants to eat food.  Just received insulin per sliding scale due to elevated CBG of 181.  Objective: I have reviewed patient's vital signs and intake and output. Vitals:   06/01/17 1415 06/01/17 1507  BP: 133/82 124/73  Pulse: (!) 102 95  Resp: 19 16  Temp: 98.4 F (36.9 C) 98.3 F (36.8 C)  SpO2: 98% 98%    I/O - 2750 cc IV/2895 cc UO.   General: alert and cooperative Resp: clear to auscultation bilaterally Cardio: regular rate and rhythm, S1, S2 normal, no murmur, click, rub or gallop GI: soft, non-tender; bowel sounds normal; no masses,  no organomegaly and incision: clean, dry and intact Extremities: Ted hose and PAS on. Vaginal Bleeding: minimal  Assessment: s/p Procedure(s): HYSTERECTOMY ABDOMINAL WITH Bilateral SALPINGECTOMY (Bilateral): stable. Diabetes mellitus.   Plan: Advance diet Encourage ambulation Continue foley due to post op status.   Morphine and Toradol for pain.  Will advance diet and start Metformin.  CBG at hs.  CBC and BMP in the am.  I discussed surgical findings and procedure.   Questions answered.    LOS: 0 days    Arloa Koh 06/01/2017, 6:25 PM

## 2017-06-01 NOTE — Anesthesia Postprocedure Evaluation (Signed)
Anesthesia Post Note  Patient: [name] [name]  Procedure(s) Performed: Procedure(s) (LRB): HYSTERECTOMY ABDOMINAL WITH Bilateral SALPINGECTOMY (Bilateral)     Patient location during evaluation: Women's Unit Anesthesia Type: General Level of consciousness: awake and alert and oriented Pain management: pain level controlled Vital Signs Assessment: post-procedure vital signs reviewed and stable Respiratory status: nonlabored ventilation and respiratory function stable Cardiovascular status: stable Postop Assessment: adequate PO intake and no signs of nausea or vomiting Anesthetic complications: no    Last Vitals:  Vitals:   06/01/17 1315 06/01/17 1415  BP: (!) 142/81 133/82  Pulse: (!) 102 (!) 102  Resp: (!) 21 19  Temp: 36.9 C 36.9 C  SpO2: 98% 98%    Last Pain:  Vitals:   06/01/17 1435  TempSrc:   PainSc: 9    Pain Goal: Patients Stated Pain Goal: 3 (06/01/17 1206)               Willa Rough

## 2017-06-01 NOTE — Transfer of Care (Signed)
Immediate Anesthesia Transfer of Care Note  Patient: [name] [name]  Procedure(s) Performed: Procedure(s): HYSTERECTOMY ABDOMINAL WITH Bilateral SALPINGECTOMY (Bilateral)  Patient Location: PACU  Anesthesia Type:General  Level of Consciousness: awake, alert  and oriented  Airway & Oxygen Therapy: Patient Spontanous Breathing and Patient connected to nasal cannula oxygen  Post-op Assessment: Report given to RN, Post -op Vital signs reviewed and stable and Patient moving all extremities  Post vital signs: Reviewed and stable  Last Vitals:  Vitals:   06/01/17 0640 06/01/17 0710  BP: (!) 158/76   Pulse: (!) 135 (!) 136  Resp: 18   Temp:    SpO2: 100%     Last Pain:  Vitals:   06/01/17 0610  TempSrc: Oral         Complications: No apparent anesthesia complications

## 2017-06-01 NOTE — Anesthesia Procedure Notes (Signed)
Procedure Name: Intubation Date/Time: 06/01/2017 7:45 AM Performed by: Hewitt Blade Pre-anesthesia Checklist: Patient identified, Emergency Drugs available, Suction available and Patient being monitored Patient Re-evaluated:Patient Re-evaluated prior to induction Oxygen Delivery Method: Circle system utilized Preoxygenation: Pre-oxygenation with 100% oxygen Induction Type: IV induction Ventilation: Mask ventilation without difficulty Laryngoscope Size: Mac and 3 Grade View: Grade I Tube type: Oral Tube size: 7.0 mm Number of attempts: 1 Airway Equipment and Method: Rigid stylet Placement Confirmation: ETT inserted through vocal cords under direct vision,  positive ETCO2 and breath sounds checked- equal and bilateral Secured at: 22 cm Tube secured with: Tape Dental Injury: Teeth and Oropharynx as per pre-operative assessment

## 2017-06-01 NOTE — Op Note (Signed)
[name] [name]             ACCOUNT NO.:  [id number]  MEDICAL RECORD NO.:  [id number]  LOCATION:                                 FACILITY:  PHYSICIAN:  Lenard Galloway, M.D.        DATE OF BIRTH:  DATE OF PROCEDURE:  06/01/2017 DATE OF DISCHARGE:                              OPERATIVE REPORT   PREOPERATIVE DIAGNOSIS:  Uterine fibroids.  POSTOPERATIVE DIAGNOSIS:  Uterine fibroids.  PROCEDURE:  Total abdominal hysterectomy with bilateral salpingectomy.  SURGEON:  Lenard Galloway, M.D.  ASSISTANT:  Sumner Boast, MD.  ANESTHESIA:  General endotracheal.  IV FLUIDS:  1500 mL Ringer's lactate, 250 mL albumin.  URINE OUTPUT:  400 mL.  ESTIMATED BLOOD LOSS:  150 mL.  COMPLICATIONS:  None.  INDICATIONS FOR THE PROCEDURE:  The patient is a 36 year old, gravida 1, para 1, African American female, who presents with large uterine fibroids and heavy bleeding.  The patient initially presented with a 22- week size uterus and a request for hysterectomy and she was pretreated with a prolonged course of Depot Lupron therapy.  The patient's original surgery was cancelled as she was newly diagnosed with adult onset diabetes at her preoperative anesthesia visit.  The patient received further evaluation and treatment through her primary care provider and she was placed on metformin, which was able to bring her hemoglobin A1c from 8 down to 5.9.  As the patient's depot Lupron wore off around the time of her surgery, her bleeding was then controlled with Megace.  The patient now presents with desire to complete the hysterectomy procedure, and she declines any future childbearing.  Her uterus measures 24-week size.  A plan is made to proceed with a total abdominal hysterectomy with bilateral salpingectomy.  Risks, benefits, and alternatives have been reviewed with the patient, who wishes to proceed.  FINDINGS:  Under anesthesia revealed a 24-week size uterus.  The adnexa were not palpated  separately from the uterus.  The cervix was small and was drawn up retropubically behind the symphysis.  She appeared to have a large posterior lower uterine segment fibroid.  At the time of laparotomy, the patient was noted to have a 24-week size and multi fibroid uterus.  The uterus was soft and boggy.  The bilateral fallopian tubes and ovaries were unremarkable.  The appendix was normal. The pelvis demonstrated no evidence of any adhesive disease or endometriosis.  SPECIMENS:  The uterus, cervix, and bilateral fallopian tubes were sent to pathology and the weight was noted to be 1885 g.  DESCRIPTION OF PROCEDURE:  The patient was re-identified in the preoperative area.  She did receive Lovenox for DVT prophylaxis.  She received cefotetan 2 g IV for antibiotic prophylaxis.  She did also receive PAS and TED hose.  The patient was escorted to the operating room, where she was placed in the dorsal lithotomy position on the operating room table.  The TrenGuard was used to support the patient on the OR table and she was placed in [place].  The abdomen, vagina, and perineum were sterilely prepped and draped.  A Foley catheter was then placed inside the bladder.  A Pfannenstiel incision was created sharply  with a scalpel and this was carried down to the fascia using monopolar cautery for hemostasis.  The fascia was incised with a curved Mayo scissors bilaterally.  The rectus muscles were dissected off the fascia superiorly and inferiorly.  The rectus muscles were divided in the midline.  The parietal peritoneum was identified and elevated with 2 hemostat clamps.  It was entered sharply with the Metzenbaum scissors, and the incision was extended cranially and caudally.  The uterus was delivered through the abdominal incision.  The hysterectomy procedure was begun.  The right round ligament was grasped with a Babcock clamp and was suture ligated with a transfixing suture of 0  Vicryl.  The anterior and posterior leaves of the broad ligament were opened with monopolar cautery and with a Metzenbaum scissors.  A long Kelly clamp had been placed across the adnexal structures.  A window was created through the posterior leaf of the broad ligament and the uteroovarian pedicle was identified.  A long Kelly clamp was placed across the mesosalpinx and the fallopian tube was excised using monopolar cautery and was set aside to later be sent to Pathology with the entire specimen.  The mesosalpinx pedicle was free tied with 0 Vicryl.  The utero-ovarian pedicle was now clamped and sharply divided.  It was suture ligated with a free tie of 0 Vicryl, followed by a transfixing suture of the same.  The uterine artery was skeletonized and the bladder flap was sharply taken down over the anterior lower uterine segment.  The uterine artery was clamped, sharply divided, and suture ligated with 2 sutures of 0 Vicryl.  The inferior branches of the uterine artery were then clamped on the same side, sharply divided, and again suture ligated with 0 Vicryl.    The same procedure that was performed on the patient's right- hand side was then repeated on the patient's left-hand side.    At this time, a hand was inserted behind the cervix and the uterine fundus was amputated from the cervix using monopolar cautery.  The uterus was set aside and later sent to Pathology with the entire specimen.  The bladder was further dissected off the anterior cervix.  The uterosacral ligaments were then each clamped, sharply divided, and suture ligated with transfixing sutures of 0 Vicryl.  The cervical specimen was sharply excised and sent to Pathology with the entire specimen. The vaginal apex was then closed with a figure-of-eight suture of 0 Vicryl.  The pelvis was irrigated and suctioned, and all of the pedicles were examined.  There was some mild bleeding along the vaginal cuff and this responded  to 2 superficial figure-of-eight sutures on the right-hand side of the cuff and some monopolar cautery to an isolated tiny vessel along the cuff on the patient's left-hand side.  There was a small bleeding vein underneath a bladder flap anteriorly and again this responded to monopolar cautery.  The pelvis was examined and the ureters were identified and noted to peristalsis normally.  Hemostasis was good at all pedicle sites, and the abdomen was closed after placing Arista over the surgical field.  The parietal peritoneum was closed with a running suture of 2-0 Vicryl. The fascia was closed with a running suture of 0 Vicryl bilaterally. The subcutaneous layer was irrigated and suctioned and made hemostatic with monopolar cautery.  Interrupted sutures of 2-0 plain were placed in the subcutaneous layer.  The skin was closed with a subcuticular suture of 4-0 Vicryl.  Dermabond was placed over the incision.  The patient's catheter was left to gravity drainage.  The patient was awakened and extubated and escorted to the recovery room in stable condition.  There were no complications to the procedure.  All needle, instrument, and sponge counts were correct.     Lenard Galloway, M.D.     BES/MEDQ  D:  06/01/2017  T:  06/01/2017  Job:  [id number]

## 2017-06-02 ENCOUNTER — Encounter (HOSPITAL_COMMUNITY): Payer: Self-pay | Admitting: Obstetrics and Gynecology

## 2017-06-02 LAB — CBC
HEMATOCRIT: 34 % — AB (ref 36.0–46.0)
Hemoglobin: 10.8 g/dL — ABNORMAL LOW (ref 12.0–15.0)
MCH: 25.1 pg — AB (ref 26.0–34.0)
MCHC: 31.8 g/dL (ref 30.0–36.0)
MCV: 79.1 fL (ref 78.0–100.0)
Platelets: 321 10*3/uL (ref 150–400)
RBC: 4.3 MIL/uL (ref 3.87–5.11)
RDW: 15.2 % (ref 11.5–15.5)
WBC: 10.3 10*3/uL (ref 4.0–10.5)

## 2017-06-02 LAB — BASIC METABOLIC PANEL
Anion gap: 8 (ref 5–15)
BUN: <5 mg/dL — ABNORMAL LOW (ref 6–20)
CALCIUM: 9.7 mg/dL (ref 8.9–10.3)
CO2: 24 mmol/L (ref 22–32)
CREATININE: 0.91 mg/dL (ref 0.44–1.00)
Chloride: 106 mmol/L (ref 101–111)
GFR calc Af Amer: >60 mL/min (ref >60)
GFR calc non Af Amer: >60 mL/min (ref >60)
GLUCOSE: 111 mg/dL — AB (ref 65–99)
Potassium: 3.6 mmol/L (ref 3.5–5.1)
Sodium: 138 mmol/L (ref 135–145)

## 2017-06-02 NOTE — Progress Notes (Signed)
1 Day Post-Op Procedure(s) (LRB): HYSTERECTOMY ABDOMINAL WITH Bilateral SALPINGECTOMY (Bilateral)  Subjective: Patient reports tolerating PO, + flatus and no problems voiding.    Objective: I have reviewed patient's vital signs, intake and output and labs.  General: alert and cooperative Resp: clear to auscultation bilaterally Cardio: regular rate and rhythm, S1, S2 normal, no murmur, click, rub or gallop GI: soft, non-tender; bowel sounds normal; no masses,  no organomegaly and incision: clean, dry and intact Extremities: Ted hose on.  Vaginal Bleeding: minimal  Assessment: s/p Procedure(s): HYSTERECTOMY ABDOMINAL WITH Bilateral SALPINGECTOMY (Bilateral): progressing well  Plan: Encourage ambulation Advance to PO medication  Metformin 500 mg po bid.   LOS: 1 day    [employer] 06/02/2017, 8:07 AM

## 2017-06-02 NOTE — Progress Notes (Signed)
Wasted 55ml morphine PCA with Ivin Poot, RN

## 2017-06-02 NOTE — Progress Notes (Signed)
Wasted 16 mL morphine PCA with Tarry Kos, RN

## 2017-06-03 LAB — CBC
HEMATOCRIT: 33.6 % — AB (ref 36.0–46.0)
Hemoglobin: 10.7 g/dL — ABNORMAL LOW (ref 12.0–15.0)
MCH: 25.3 pg — ABNORMAL LOW (ref 26.0–34.0)
MCHC: 31.8 g/dL (ref 30.0–36.0)
MCV: 79.4 fL (ref 78.0–100.0)
Platelets: 302 10*3/uL (ref 150–400)
RBC: 4.23 MIL/uL (ref 3.87–5.11)
RDW: 15.2 % (ref 11.5–15.5)
WBC: 9.9 10*3/uL (ref 4.0–10.5)

## 2017-06-03 MED ORDER — IBUPROFEN 800 MG PO TABS: 800.0000 mg | ORAL_TABLET | Freq: Three times a day (TID) | ORAL | 0 refills | Status: DC | PRN

## 2017-06-03 MED ORDER — OXYCODONE-ACETAMINOPHEN 5-325 MG PO TABS: 1.0000 | ORAL_TABLET | ORAL | 0 refills | Status: DC | PRN

## 2017-06-03 MED FILL — IBUPROFEN 800 MG TABLET: 800 | 10 days supply | Qty: 30 | Fill #0

## 2017-06-03 MED FILL — OXYCODONE-ACETAMINOPHEN 5-3: 5-325 | 4 days supply | Qty: 30 | Fill #0

## 2017-06-03 NOTE — Discharge Instructions (Signed)
Abdominal Hysterectomy °Abdominal hysterectomy is a surgical procedure to remove the womb (uterus). The uterus is the muscular organ that houses a developing baby. This surgery may be done if: °· You have cancer. °· You have growths (tumors or fibroids) in the uterus. °· You have long-term (chronic) pain. °· You are bleeding. °· Your uterus has slipped down into your vagina (uterine prolapse). °· You have a condition in which the tissue that lines the uterus grows outside of its normal location (endometriosis). °· You have an infection in your uterus. °· You are having problems with your menstrual cycle. ° °Depending on why you are having this procedure, you may also have other reproductive organs removed. These could include: °· The part of your vagina that connects with your uterus (cervix). °· The organs that make eggs (ovaries). °· The tubes that connect the ovaries to the uterus (fallopian tubes). ° °Tell a health care provider about: °· Any allergies you have. °· All medicines you are taking, including vitamins, herbs, eye drops, creams, and over-the-counter medicines. °· Any problems you or family members have had with anesthetic medicines. °· Any blood disorders you have. °· Any surgeries you have had. °· Any medical conditions you have. °· Whether you are pregnant or may be pregnant. °What are the risks? °Generally, this is a safe procedure. However, problems may occur, including: °· Bleeding. °· Infection. °· Allergic reactions to medicines or dyes. °· Damage to other structures or organs. °· Nerve injury. °· Decreased interest in sex or pain during sex. °· Blood clots that can break free and travel to your lungs. ° °What happens before the procedure? °Staying hydrated °Follow instructions from your health care provider about hydration, which may include: °· Up to 2 hours before the procedure - you may continue to drink clear liquids, such as water, clear fruit juice, black coffee, and plain tea ° °Eating  and drinking restrictions °Follow instructions from your health care provider about eating and drinking, which may include: °· 8 hours before the procedure - stop eating heavy meals or foods such as meat, fried foods, or fatty foods. °· 6 hours before the procedure - stop eating light meals or foods, such as toast or cereal. °· 6 hours before the procedure - stop drinking milk or drinks that contain milk. °· 2 hours before the procedure - stop drinking clear liquids. ° °Medicines °· Ask your health care provider about: °? Changing or stopping your regular medicines. This is especially important if you are taking diabetes medicines or blood thinners. °? Taking medicines such as aspirin and ibuprofen. These medicines can thin your blood. Do not take these medicines before your procedure if your health care provider instructs you not to. °· You may be given antibiotic medicine to help prevent infection. Take it as told by your health care provider. °· You may be asked to take laxatives to prevent constipation. °General instructions °· Ask your health care provider how your surgical site will be marked or identified. °· You may be asked to shower with a germ-killing soap. °· Plan to have someone take you home from the hospital. °· Do not use any products that contain nicotine or tobacco, such as cigarettes and e-cigarettes. If you need help quitting, ask your health care provider. °· You may have an exam or testing. °· You may have a blood or urine sample taken. °· You may need to have an enema to clean out your rectum and lower colon. °· This   procedure can affect the way you feel about yourself. Talk to your health care provider about the physical and emotional changes this procedure may cause. °What happens during the procedure? °· To lower your risk of infection: °? Your health care team will wash or sanitize their hands. °? Your skin will be washed with soap. °? Hair may be removed from the surgical area. °· An IV  tube will be inserted into one of your veins. °· You will be given one or more of the following: °? A medicine to help you relax (sedative). °? A medicine to make you fall asleep (general anesthetic). °· Tight-fitting (compression) stockings will be placed on your legs to promote circulation. °· A thin, flexible tube (catheter) will be inserted to help drain your urine. °· The surgeon will make a cut (incision) through the skin in your lower belly. The incision may go side-to-side or up-and-down. °· The surgeon will move aside the body tissue that covers your uterus. The surgeon will then carefully take out your uterus along with any of the other organs that need to be removed. °· Bleeding will be controlled with clamps or sutures. °· The surgeon will close your incision with stitches (sutures), skin glue, or adhesive strips. °· A bandage (dressing) will be placed over the incision. °The procedure may vary among health care providers and hospitals. °What happens after the procedure? °· You will be given pain medicine as needed. °· Your blood pressure, heart rate, breathing rate, and blood oxygen level will be monitored until the medicines you were given have worn off. °· You will need to stay in the hospital to recover for one to two days. Ask your health care provider how long you will need to stay in the hospital after your procedure. °· You may have a liquid diet at first. You will most likely return to your usual diet the day after surgery. °· You will still have the urinary catheter in place. It will likely be removed the day after surgery. °· You may have to wear compression stockings. These stockings help to prevent blood clots and reduce swelling in your legs. °· You will be encouraged to walk as soon as possible. You will also use a device or do breathing exercises to keep your lungs clear. °· You may need to use a sanitary napkin for vaginal discharge. °Summary °· Abdominal hysterectomy is a surgical  procedure to remove the womb (uterus). The uterus is the muscular organ that houses a developing baby. °· This procedure can affect the way you feel about yourself. Talk to your health care provider about the physical and emotional changes this procedure may cause. °· You will be given medicines for pain after the procedure. °· You will need to stay in the hospital to recover. Ask your health care provider how long you will need to stay in the hospital after your procedure. °This information is not intended to replace advice given to you by your health care provider. Make sure you discuss any questions you have with your health care provider. °Document Released: 09/27/2013 Document Revised: 09/10/2016 Document Reviewed: 09/10/2016 °Elsevier Interactive Patient Education © 2017 [employer]. ° °

## 2017-06-03 NOTE — Progress Notes (Signed)
2 Days Post-Op Procedure(s) (LRB): HYSTERECTOMY ABDOMINAL WITH Bilateral SALPINGECTOMY (Bilateral)  Subjective: Patient reports tolerating PO and + flatus.   Good pain control with Percocet and Motrin.   Objective: I have reviewed patient's vital signs, intake and output and labs. Vitals:   06/02/17 2348 06/03/17 0405  BP: 100/65 113/62  Pulse: 72 74  Resp: 18 18  Temp: 98.2 F (36.8 C) 98.5 F (36.9 C)  SpO2: 98% 98%   Hgb 10.8  General: alert and cooperative Resp: clear to auscultation bilaterally Cardio: regular rate and rhythm, S1, S2 normal, no murmur, click, rub or gallop GI: soft, non-tender; bowel sounds normal; no masses,  no organomegaly and incision: clean, dry and intact Extremities: Ted hose off. Vaginal Bleeding: none  Final pathology report - adenomyosis, degenerating fibroids, benign cervix and benign tubes.  Assessment: s/p Procedure(s): HYSTERECTOMY ABDOMINAL WITH Bilateral SALPINGECTOMY (Bilateral): progressing well  Plan: Discharge home Instructions reviewed in verbal and written form.  Percocet and Motrin.  Follow up in 2 days.    LOS: 2 days    [name] [name] 06/03/2017, 8:08 AM

## 2017-06-03 NOTE — Progress Notes (Signed)
Discharge teaching complete. Pt understood all information and did not have any questions. Pain med given before discharge. Pt pushed via wheelchair and discharged home to family.

## 2017-06-04 ENCOUNTER — Other Ambulatory Visit: Payer: Self-pay | Admitting: *Deleted

## 2017-06-04 NOTE — Patient Outreach (Signed)
[facility]) Care Management  06/04/2017  [name] [name] [birth year] [id number]   Subjective: Telephone call to patient's home  / mobile number, no answer, left HIPAA compliant voicemail message, and requested call back.   Objective:Per chart review, patient hospitalized  06/01/17 - 06/03/17 for Uterine fibroids.   Status post Total abdominal hysterectomy with bilateral salpingectomy on 06/01/17.   Assessment: Received UMR Preoperative follow up call referral on 05/21/17. Preoperative call completed, and transition of care follow up,  pending patient contact.   Plan: RNCM will call patient for 2nd telephone outreach attempt, transition of care follow up, within 10 business days if no return call.    Trixy Loyola H. Annia Friendly, BSN, [place] [facility] Telephonic CM Phone: [phone] Fax: [phone]

## 2017-06-05 ENCOUNTER — Ambulatory Visit (INDEPENDENT_AMBULATORY_CARE_PROVIDER_SITE_OTHER): Payer: 59 | Admitting: Obstetrics and Gynecology

## 2017-06-05 ENCOUNTER — Encounter: Payer: Self-pay | Admitting: Obstetrics and Gynecology

## 2017-06-05 ENCOUNTER — Other Ambulatory Visit: Payer: Self-pay | Admitting: *Deleted

## 2017-06-05 ENCOUNTER — Encounter: Payer: Self-pay | Admitting: *Deleted

## 2017-06-05 VITALS — BP 112/70 | HR 84 | Resp 16 | Wt 192.0 lb

## 2017-06-05 DIAGNOSIS — Z90710 Acquired absence of both cervix and uterus: Secondary | ICD-10-CM

## 2017-06-05 NOTE — Progress Notes (Signed)
GYNECOLOGY  VISIT   HPI: 36 y.o.   Single  African American  female   [id number] with Patient's last menstrual period was 03/11/2017 (exact date).   here for post op HYSTERECTOMY ABDOMINAL WITH Bilateral SALPINGECTOMY (Bilateral Abdomen)    Taking Percocet and Motrin.  No BM yet.  No nausea or vomiting.   Final pathology report - adenomyosis, degenerating fibroid, and endometrial polyp, benign cervix, benign tubes.  GYNECOLOGIC HISTORY: Patient's last menstrual period was 03/11/2017 (exact date). Contraception:  Hysterectomy Menopausal hormone therapy:  n/a Last mammogram:  n/a Last pap smear:   02-19-17 neg HPV HR neg        OB History    Gravida Para Term Preterm AB Living   1 1 1     1    SAB TAB Ectopic Multiple Live Births                     Patient Active Problem List   Diagnosis Date Noted  . Status post total abdominal hysterectomy 06/01/2017  . Hyperparathyroidism, primary ([place]) 10/28/2016  . Thyroid nodule 10/28/2016    Past Medical History:  Diagnosis Date  . Abnormal uterine bleeding   . Anemia    history of  . Diabetes mellitus without complication ([place])   . Elevated hemoglobin A1c   . Elevated liver function tests 05/28/2016   Resolved July 2018.  Marland Kitchen Fatty liver   . GERD (gastroesophageal reflux disease)   . Hypercalcemia due to hyperthyroidism   . Hyperparathyroidism, primary [facility])    endocrinologist-  dr Loanne Drilling  . Hypertension   . Thyroid nodule    per endocrinoloigst note (dr ellision)  secondary to primary hyperparathyroidism  . Uterine fibroid     Past Surgical History:  Procedure Laterality Date  . HYSTERECTOMY ABDOMINAL WITH SALPINGECTOMY Bilateral 06/01/2017   Procedure: HYSTERECTOMY ABDOMINAL WITH Bilateral SALPINGECTOMY;  Surgeon: Nunzio Cobbs, MD;  Location: [facility];  Service: Gynecology;  Laterality: Bilateral;  . NO PAST SURGERIES      Current Outpatient Prescriptions  Medication Sig Dispense Refill  . amLODipine  (NORVASC) 10 MG tablet Take 10 mg by mouth daily.   1  . ibuprofen (ADVIL,MOTRIN) 800 MG tablet Take 1 tablet (800 mg total) by mouth every 8 (eight) hours as needed (mild pain). 30 tablet 0  . losartan (COZAAR) 50 MG tablet Take 25 mg by mouth daily.   0  . metFORMIN (GLUCOPHAGE-XR) 500 MG 24 hr tablet Take 500 mg by mouth 2 (two) times daily.   0  . omeprazole (PRILOSEC) 20 MG capsule Take 20 mg by mouth daily as needed (for acid reflux/indigestion.).     Marland Kitchen oxyCODONE-acetaminophen (PERCOCET/ROXICET) 5-325 MG tablet Take 1-2 tablets by mouth every 4 (four) hours as needed for severe pain (moderate to severe pain (when tolerating fluids)). 30 tablet 0  . VITAMIN E HIGH POTENCY 400 units capsule Take 400 Units by mouth daily.   11   No current facility-administered medications for this visit.      ALLERGIES: Patient has no known allergies.  Family History  Problem Relation Age of Onset  . Stroke Mother   . Hypertension Mother   . Breast cancer Sister 42       A & W.  Genetic testing negative.  . Diabetes Paternal Grandmother   . Hypercalcemia Neg Hx     Social History   Social History  . Marital status: Single    Spouse name: N/A  .  Number of children: N/A  . Years of education: N/A   Occupational History  . Not on file.   Social History Main Topics  . Smoking status: Never Smoker  . Smokeless tobacco: Never Used  . Alcohol use 1.2 oz/week    2 Standard drinks or equivalent per week     Comment: occasional  . Drug use: No  . Sexual activity: Yes    Partners: Male    Birth control/ protection: Condom, Surgical     Comment: Hysterectomy   Other Topics Concern  . Not on file   Social History Narrative  . No narrative on file    ROS:  Pertinent items are noted in HPI.  PHYSICAL EXAMINATION:    BP 112/70 (BP Location: Right Arm, Patient Position: Sitting, Cuff Size: Normal)   Pulse 84   Resp 16   Wt 192 lb (87.1 kg)   LMP 03/11/2017 (Exact Date)   BMI 30.99  kg/m     General appearance: alert, cooperative and appears stated age   Abdomen: Pfannenstiel incision intact, soft, non-tender, no masses,  no organomegaly   Chaperone was present for exam.  ASSESSMENT  Status post TAH/bilateral salpingectomy.  Constipation post op.  PLAN  Reviewed final pathology report.  Use daily Miralax until BMs start.  Then take Colace daily.  Discussed increasing walking but no exercise for 6 weeks.  Follow up for 6 week post op visit.    An After Visit Summary was printed and given to the patient.

## 2017-06-05 NOTE — Patient Instructions (Signed)
Try Miralax daily for constipation until you have a bowel movement.  Then return to a daily stool softener.

## 2017-06-05 NOTE — Patient Outreach (Signed)
[facility]) Care Management  06/05/2017  [name] [name] 1981/04/13 [id number]   Subjective: Telephone call to patient's home / mobile number, spoke with patient, and HIPAA verified.  Discussed [facility] Care Management UMR Transition of care follow up, patient voiced understanding, and is in agreement to follow up.   Patient states she is doing good, has follow up appointment with surgeon today, and is tolerating activities of daily living. States she has hypertension and diabetes.  States she is interested in the [employer] Wellness or [employer] and has the contact information to follow up post hospitalization recovery. Patient voices understanding of medical diagnosis, surgery,  and treatment plan.  [facility] benefits discussed on 05/27/17 preoperative call,  and patient verbally given contact number for [facility] Patient Accounting [phone]), per her request.  Patient states she will follow up and request itemized bill for hospital indemnity claim follow up. States she is very appreciative of the follow up and is in agreement to receive [facility] information.   Objective:Per chart review, patient hospitalized  06/01/17 - 06/03/17 for Uterine fibroids.   Status post Total abdominal hysterectomy with bilateral salpingectomy on 06/01/17.   Assessment: Received UMR Preoperative follow up call referral on 05/21/17. Preoperative call completed, and transition of care follow up completed, and will proceed with case closure.    Plan: RNCM will send patient successful outreach letter, [facility] pamphlet, and magnet. RNCM will send case closure due to follow up completed / no care management needs request to [facility].    Dayvin Aber H. Annia Friendly, BSN, [place] Management [facility] Telephonic CM Phone: [phone] Fax: [phone]

## 2017-06-08 NOTE — Discharge Summary (Signed)
Physician Discharge Summary  Patient ID: [name] [name] MRN: [id number] DOB/AGE: 01-04-1981 36 y.o.  Admit date: 06/01/2017 Discharge date:  06/03/17.  Admission Diagnoses: 1.  Uterine fibroids. 2.  Adult onset diabetes mellitus.  Discharge Diagnoses:  1.  Degenerating uterine fibroids. 2.  Adenomyosis. 3.  Benign endometrial polyp. 4.  Status post post total abdominal hysterectomy with bilateral salpingectomy.  5.  Adult onset diabetes mellitus.   Active Problems:   Status post total abdominal hysterectomy   Discharged Condition: good  Hospital Course:  The patient was admitted on 06/01/17  for a total abdominal hysterectomy with bilateral salpingectomy which were performed without complication while under general anesthesia.  The patient's post op course was uneventful.  She had a morphine PCA and Toradol for pain control initially, and this was converted over to Percocet and Motrin on post op day one.  The patient had excellent bowel sounds, and she received a low carbohydrate diet on the evening after her surgery.  She did receive one dose of insulin when she was taking clear liquids only.  She was restarted on her Metformin on post op day one.  She ambulated independently and wore PAS and Ted hose for DVT prophylaxis while in bed.  She received a dosage of Lovenox for DVT prophylaxis.  Her foley catheter were removed on post op day one, and she voided good volumes. The patient's vital signs remained stable and she demonstrated no signs of infection during her hospitalization.  She has a very low grade fever prior to surgery and immediately after surgery,and this was nonfocal.  When her final pathology report returned showing degenerating fibroids, the fever was attributed to this.  The patient's post op day one Hgb was 10.8, and her post op day two Hgb was 10.7.  She was tolerating the this well.  She had very minimal vaginal bleeding, and her incision(s) demonstrated no signs of  erythema or significant drainage.  She was found to be in good condition and ready for discharge on post op day two.  Consults: None  Significant Diagnostic Studies: labs:  See hospital course and final pathology report showing degenerating fibroids, adenomyosis, and benign endometrial polyp.  Treatments: surgery:  Total abdominal hysterectomy with bilateral salpingectomy.   Discharge Exam: Blood pressure 128/82, pulse 74, temperature 98.8 F (37.1 C), temperature source Oral, resp. rate 16, height 5\' 6"  (1.676 m), weight 199 lb (90.3 kg), SpO2 99 %. General: alert and cooperative Resp: clear to auscultation bilaterally Cardio: regular rate and rhythm, S1, S2 normal, no murmur, click, rub or gallop GI: soft, non-tender; bowel sounds normal; no masses,  no organomegaly and incision: clean, dry and intact Extremities: Ted hose off. Vaginal Bleeding: none   Disposition: 01-Home or Self Care  Discharge instructions were reviewed in verbal and written form.  Allergies as of 06/03/2017   No Known Allergies     Medication List    STOP taking these medications   ketorolac 10 MG tablet Commonly known as:  TORADOL   megestrol 40 MG tablet Commonly known as:  MEGACE     TAKE these medications   amLODipine 10 MG tablet Commonly known as:  NORVASC Take 10 mg by mouth daily.   ibuprofen 800 MG tablet Commonly known as:  ADVIL,MOTRIN Take 1 tablet (800 mg total) by mouth every 8 (eight) hours as needed (mild pain). What changed:  medication strength  reasons to take this   losartan 50 MG tablet Commonly known as:  COZAAR Take  25 mg by mouth daily.   metFORMIN 500 MG 24 hr tablet Commonly known as:  GLUCOPHAGE-XR Take 500 mg by mouth 2 (two) times daily.   omeprazole 20 MG capsule Commonly known as:  PRILOSEC Take 20 mg by mouth daily as needed (for acid reflux/indigestion.).   oxyCODONE-acetaminophen 5-325 MG tablet Commonly known as:  PERCOCET/ROXICET Take 1-2 tablets  by mouth every 4 (four) hours as needed for severe pain (moderate to severe pain (when tolerating fluids)).   VITAMIN E HIGH POTENCY 400 UNIT capsule Generic drug:  vitamin E Take 400 Units by mouth daily.            Discharge Care Instructions        Start     Ordered   06/03/17 0000  ibuprofen (ADVIL,MOTRIN) 800 MG tablet  Every 8 hours PRN     06/03/17 0806   06/03/17 0000  oxyCODONE-acetaminophen (PERCOCET/ROXICET) 5-325 MG tablet  Every 4 hours PRN     06/03/17 6010     Follow-up Information    Nunzio Cobbs, MD In 2 days.   Specialty:  Obstetrics and Gynecology Contact information: [address] [place] [address] [phone]           Signed: Arloa Koh 06/08/2017, 11:33 AM

## 2017-06-10 ENCOUNTER — Telehealth: Payer: Self-pay | Admitting: Obstetrics and Gynecology

## 2017-06-10 ENCOUNTER — Telehealth: Payer: Self-pay | Admitting: *Deleted

## 2017-06-10 ENCOUNTER — Encounter: Payer: Self-pay | Admitting: Obstetrics and Gynecology

## 2017-06-10 NOTE — Telephone Encounter (Signed)
Returned call to patient and noted benefits information. See account notes for details.

## 2017-06-10 NOTE — Telephone Encounter (Signed)
Spoke with patient. S/p TAH on 06/01/17, Dr. Quincy Simmonds. Reports hard lump when pressing down on incision. One under left side of incision and one under right side. Patient unable to determine size.   Reports low transverse incision is closed and intact.  Denies drainage, redness, odor, fever/chills, tenderness or pain.   Advised patient Dr. Quincy Simmonds is out of the office, will review with covering provider and return call, patient is agreeable.   Dr. Talbert Nan, please review and advise?   Cc: Dr. Quincy Simmonds     From Michel Sant[place] To Nunzio Cobbs, MD Sent 06/10/2017 12:57 PM  Good afternoon,  I know Dr.Silva is out of the office on vacation. I'm starting to notice that I have hard lumps/knots under the skin by my incision on both my left and right side. They aren't painful to touch. Is this something I should expect that will go away or may something be wrong?  Thanks  [employer]

## 2017-06-10 NOTE — Telephone Encounter (Signed)
Patient is talk with Deloris Ping about benefits for a procedure.

## 2017-06-10 NOTE — Telephone Encounter (Signed)
Reviewed with Dr. Talbert Nan, recommended OV for further evaluation.   Spoke with patient, scheduled for OV on 06/11/17 at 3:30pm with Dr. Talbert Nan. Patient is agreeable to date and time.  Patient is agreeable to disposition. Will close encounter.  Cc: Dr. Quincy Simmonds

## 2017-06-10 NOTE — Telephone Encounter (Signed)
See telephone encounter dated 06/10/17.

## 2017-06-11 ENCOUNTER — Ambulatory Visit (INDEPENDENT_AMBULATORY_CARE_PROVIDER_SITE_OTHER): Payer: 59 | Admitting: Obstetrics and Gynecology

## 2017-06-11 ENCOUNTER — Encounter: Payer: Self-pay | Admitting: Obstetrics and Gynecology

## 2017-06-11 VITALS — BP 122/84 | HR 92 | Resp 14 | Wt 189.0 lb

## 2017-06-11 DIAGNOSIS — L7682 Other postprocedural complications of skin and subcutaneous tissue: Secondary | ICD-10-CM

## 2017-06-11 DIAGNOSIS — G8918 Other acute postprocedural pain: Secondary | ICD-10-CM

## 2017-06-11 MED ORDER — OXYCODONE-ACETAMINOPHEN 5-325 MG PO TABS: 1.0000 | ORAL_TABLET | Freq: Four times a day (QID) | ORAL | 0 refills | Status: DC | PRN

## 2017-06-11 MED FILL — OXYCODONE-ACETAMINOPHEN 5-3: 5-325 | 2 days supply | Qty: 10 | Fill #0

## 2017-06-11 NOTE — Progress Notes (Signed)
GYNECOLOGY  VISIT   HPI: 35 y.o.   Single  African American  female   [id number] with Patient's last menstrual period was 03/11/2017 (exact date).   Here 11 days s/p  TAH for incision check. Patient c/o lower abdominal pain She felt a hard spot on either side of her incision yesterday and is worried about it. No drainage, no redness around her incision. Her lower abdominal pain is improving slowly, but still very uncomfortable. Occasional sharp pains, ? Gas pains. She is having BM, better with miralax. Voiding normally.  She is taking the ibuprofen, but it doesn't last 8 hours. Has run out of percocet. Not adequate pain control.   GYNECOLOGIC HISTORY: Patient's last menstrual period was 03/11/2017 (exact date). Contraception:hysterectomy  Menopausal hormone therapy: none         OB History    Gravida Para Term Preterm AB Living   1 1 1     1    SAB TAB Ectopic Multiple Live Births                     Patient Active Problem List   Diagnosis Date Noted  . Status post total abdominal hysterectomy 06/01/2017  . Hyperparathyroidism, primary ([place]) 10/28/2016  . Thyroid nodule 10/28/2016    Past Medical History:  Diagnosis Date  . Abnormal uterine bleeding   . Anemia    history of  . Diabetes mellitus without complication ([place])   . Elevated hemoglobin A1c   . Elevated liver function tests 05/28/2016   Resolved July 2018.  Marland Kitchen Fatty liver   . GERD (gastroesophageal reflux disease)   . Hypercalcemia due to hyperthyroidism   . Hyperparathyroidism, primary [facility])    endocrinologist-  dr Loanne Drilling  . Hypertension   . Thyroid nodule    per endocrinoloigst note (dr ellision)  secondary to primary hyperparathyroidism  . Uterine fibroid     Past Surgical History:  Procedure Laterality Date  . HYSTERECTOMY ABDOMINAL WITH SALPINGECTOMY Bilateral 06/01/2017   Procedure: HYSTERECTOMY ABDOMINAL WITH Bilateral SALPINGECTOMY;  Surgeon: Nunzio Cobbs, MD;  Location: [facility];  Service:  Gynecology;  Laterality: Bilateral;  . NO PAST SURGERIES      Current Outpatient Prescriptions  Medication Sig Dispense Refill  . amLODipine (NORVASC) 10 MG tablet Take 10 mg by mouth daily.   1  . ibuprofen (ADVIL,MOTRIN) 800 MG tablet Take 1 tablet (800 mg total) by mouth every 8 (eight) hours as needed (mild pain). 30 tablet 0  . losartan (COZAAR) 50 MG tablet Take 25 mg by mouth daily.   0  . metFORMIN (GLUCOPHAGE-XR) 500 MG 24 hr tablet Take 500 mg by mouth 2 (two) times daily.   0  . omeprazole (PRILOSEC) 20 MG capsule Take 20 mg by mouth daily as needed (for acid reflux/indigestion.).     Marland Kitchen VITAMIN E HIGH POTENCY 400 units capsule Take 400 Units by mouth daily.   11   No current facility-administered medications for this visit.      ALLERGIES: Patient has no known allergies.  Family History  Problem Relation Age of Onset  . Stroke Mother   . Hypertension Mother   . Breast cancer Sister 32       A & W.  Genetic testing negative.  . Diabetes Paternal Grandmother   . Hypercalcemia Neg Hx     Social History   Social History  . Marital status: Single    Spouse name: N/A  . Number of children:  N/A  . Years of education: N/A   Occupational History  . Not on file.   Social History Main Topics  . Smoking status: Never Smoker  . Smokeless tobacco: Never Used  . Alcohol use 1.2 oz/week    2 Standard drinks or equivalent per week     Comment: occasional  . Drug use: No  . Sexual activity: Yes    Partners: Male    Birth control/ protection: Condom, Surgical     Comment: Hysterectomy   Other Topics Concern  . Not on file   Social History Narrative  . No narrative on file    Review of Systems  Constitutional: Negative.   HENT: Negative.   Eyes: Negative.   Respiratory: Negative.   Cardiovascular: Negative.   Gastrointestinal: Negative.   Genitourinary:       Lower abdominal pain  Musculoskeletal: Negative.   Skin: Negative.   Neurological: Negative.    Endo/Heme/Allergies: Negative.   Psychiatric/Behavioral: Negative.     PHYSICAL EXAMINATION:    BP 122/84 (BP Location: Right Arm, Patient Position: Sitting, Cuff Size: Normal)   Pulse 92   Resp 14   Wt 189 lb (85.7 kg)   LMP 03/11/2017 (Exact Date)   BMI 30.51 kg/m     General appearance: alert, cooperative and appears stated age Abdomen: soft, mild BLQ abdominal tenderness, R>L ; no masses,  no organomegaly Incision: mildly indurated, no erythema, no drainage, intact.   Pelvic: External genitalia:  no lesions              Urethra:  normal appearing urethra with no masses, tenderness or lesions              Bartholins and Skenes: normal                 Vagina: normal appearing vagina with normal color and discharge, no lesions              Cervix:absent  Vaginal cuff: intact, appropriately tender, no masses  ASSESSMENT Post op pain, no signs of infection Slight induration at surgical incision, no signs of infection    PLAN Patient reassured, call with fever, worsening pain, or any other concerns Take ibuprofen around the clock, tylenol as needed, percocet sparingly (aware there is tylenol in the percocet and not to take too much tylenol) F/U prn or for routine post op care   An After Visit Summary was printed and given to the patient.  CC: Dr Quincy Simmonds

## 2017-06-16 MED FILL — METFORMIN HCL ER 500 MG TAB: 500 | 90 days supply | Qty: 180 | Fill #0

## 2017-06-18 ENCOUNTER — Telehealth: Payer: Self-pay | Admitting: Obstetrics and Gynecology

## 2017-06-18 ENCOUNTER — Ambulatory Visit (INDEPENDENT_AMBULATORY_CARE_PROVIDER_SITE_OTHER): Payer: 59 | Admitting: Obstetrics and Gynecology

## 2017-06-18 ENCOUNTER — Encounter: Payer: Self-pay | Admitting: Obstetrics and Gynecology

## 2017-06-18 VITALS — BP 124/80 | HR 112 | Temp 99.1°F | Resp 16 | Wt 191.0 lb

## 2017-06-18 DIAGNOSIS — R300 Dysuria: Secondary | ICD-10-CM

## 2017-06-18 LAB — POCT URINALYSIS DIPSTICK
BILIRUBIN UA: NEGATIVE
GLUCOSE UA: NEGATIVE
Ketones, UA: NEGATIVE
Protein, UA: NEGATIVE
UROBILINOGEN UA: NEGATIVE U/dL — AB
pH, UA: 7 (ref 5.0–8.0)

## 2017-06-18 MED ORDER — SULFAMETHOXAZOLE-TRIMETHOPRIM 800-160 MG PO TABS: 1.0000 | ORAL_TABLET | Freq: Two times a day (BID) | ORAL | 0 refills | Status: DC

## 2017-06-18 MED ORDER — PHENAZOPYRIDINE HCL 200 MG PO TABS
200.0000 mg | ORAL_TABLET | Freq: Three times a day (TID) | ORAL | 0 refills | Status: DC | PRN
Start: 2017-06-18 — End: 2017-07-09

## 2017-06-18 MED FILL — PHENAZOPYRIDINE 200 MG TAB: 200 | 2 days supply | Qty: 6 | Fill #0

## 2017-06-18 MED FILL — SULFAMETHOXAZOLE/TMP DS TAB: 800-160 | 3 days supply | Qty: 6 | Fill #0

## 2017-06-18 NOTE — Progress Notes (Signed)
GYNECOLOGY  VISIT   HPI: 36 y.o.   Single  African American  female   [id number] with Patient's last menstrual period was 03/11/2017 (exact date).   here c/o dysuria X 2 days. She is about 2 weeks s/p TAH. She feels moderate pain at the end of urination, it's not a burning pain, just hurts. No increase in urgency of urination or frequency. Some mild SP discomfort with voiding. No fevers. She hasn't had a BM in a few days. Her abdominal pain is better than it was last week.   GYNECOLOGIC HISTORY: Patient's last menstrual period was 03/11/2017 (exact date). Contraception:hysterectomy  Menopausal hormone therapy: none         OB History    Gravida Para Term Preterm AB Living   1 1 1     1    SAB TAB Ectopic Multiple Live Births                     Patient Active Problem List   Diagnosis Date Noted  . Status post total abdominal hysterectomy 06/01/2017  . Hyperparathyroidism, primary ([place]) 10/28/2016  . Thyroid nodule 10/28/2016    Past Medical History:  Diagnosis Date  . Abnormal uterine bleeding   . Anemia    history of  . Diabetes mellitus without complication ([place])   . Elevated hemoglobin A1c   . Elevated liver function tests 05/28/2016   Resolved July 2018.  Marland Kitchen Fatty liver   . GERD (gastroesophageal reflux disease)   . Hypercalcemia due to hyperthyroidism   . Hyperparathyroidism, primary [facility])    endocrinologist-  dr Loanne Drilling  . Hypertension   . Thyroid nodule    per endocrinoloigst note (dr ellision)  secondary to primary hyperparathyroidism  . Uterine fibroid     Past Surgical History:  Procedure Laterality Date  . ABDOMINAL HYSTERECTOMY    . HYSTERECTOMY ABDOMINAL WITH SALPINGECTOMY Bilateral 06/01/2017   Procedure: HYSTERECTOMY ABDOMINAL WITH Bilateral SALPINGECTOMY;  Surgeon: Nunzio Cobbs, MD;  Location: [facility] [facility];  Service: Gynecology;  Laterality: Bilateral;  . NO PAST SURGERIES      Current Outpatient Prescriptions  Medication Sig Dispense Refill   . amLODipine (NORVASC) 10 MG tablet Take 10 mg by mouth daily.   1  . ibuprofen (ADVIL,MOTRIN) 800 MG tablet Take 1 tablet (800 mg total) by mouth every 8 (eight) hours as needed (mild pain). 30 tablet 0  . losartan (COZAAR) 50 MG tablet Take 25 mg by mouth daily.   0  . metFORMIN (GLUCOPHAGE-XR) 500 MG 24 hr tablet Take 500 mg by mouth 2 (two) times daily.   0  . omeprazole (PRILOSEC) 20 MG capsule Take 20 mg by mouth daily as needed (for acid reflux/indigestion.).     Marland Kitchen oxyCODONE-acetaminophen (PERCOCET) 5-325 MG tablet Take 1 tablet by mouth every 6 (six) hours as needed. use only as much as needed to relieve pain 10 tablet 0  . VITAMIN E HIGH POTENCY 400 units capsule Take 400 Units by mouth daily.   11   No current facility-administered medications for this visit.      ALLERGIES: Patient has no known allergies.  Family History  Problem Relation Age of Onset  . Stroke Mother   . Hypertension Mother   . Breast cancer Sister 57       A & W.  Genetic testing negative.  . Diabetes Paternal Grandmother   . Hypercalcemia Neg Hx     Social History   Social History  .  Marital status: Single    Spouse name: N/A  . Number of children: N/A  . Years of education: N/A   Occupational History  . Not on file.   Social History Main Topics  . Smoking status: Never Smoker  . Smokeless tobacco: Never Used  . Alcohol use 1.2 oz/week    2 Standard drinks or equivalent per week     Comment: occasional  . Drug use: No  . Sexual activity: Yes    Partners: Male    Birth control/ protection: Condom, Surgical     Comment: Hysterectomy   Other Topics Concern  . Not on file   Social History Narrative  . No narrative on file    Review of Systems  Constitutional: Negative.   HENT: Negative.   Eyes: Negative.   Respiratory: Negative.   Cardiovascular: Negative.   Gastrointestinal: Negative.   Genitourinary: Positive for dysuria.  Musculoskeletal: Negative.   Skin: Negative.    Neurological: Negative.   Endo/Heme/Allergies: Negative.   Psychiatric/Behavioral: Negative.     PHYSICAL EXAMINATION:    BP 124/80 (BP Location: Right Arm, Patient Position: Sitting, Cuff Size: Normal)   Pulse (!) 112   Resp 16   Wt 191 lb (86.6 kg)   LMP 03/11/2017 (Exact Date)   BMI 30.83 kg/m     General appearance: alert, cooperative and appears stated age Abdomen: soft, non-tender; non distended, no masses,  no organomegaly Incision: no erythema, no drainage, minimal induration (improving) CVA: not tender   Urine dip: +blood, trace leuk  ASSESSMENT Suspected cystitis    PLAN Bactrim, pyridium Send urine for ua, c&s Call with worsening symptoms, fevers, or if not better in 48 hours   An After Visit Summary was printed and given to the patient.

## 2017-06-18 NOTE — Telephone Encounter (Signed)
Spoke with patient. Reports pain toward end of stream when voiding, started on 9/12. Is concerned that she may have UTI. Patient is s/p TAH on 06/01/17.   Denies fever, frequency, urgency, flank pain.   Recommended OV for further evaluation. Patient aware Dr. Quincy Simmonds is out of the office, can schedule with covering provider. Scheduled for today at 3:45pm with Dr. Talbert Nan. Patient is agreeable to date and time.   Routing to covering provider for final review. Patient is agreeable to disposition. Will close encounter.  Cc: Dr. Quincy Simmonds

## 2017-06-18 NOTE — Telephone Encounter (Signed)
Pt thinks she has a UTI and had surgery 2 weeks ago.  Would like to speak to a nurse.

## 2017-06-18 NOTE — Patient Instructions (Signed)

## 2017-06-19 LAB — URINE CULTURE

## 2017-06-19 LAB — URINALYSIS, MICROSCOPIC ONLY

## 2017-07-08 MED FILL — AMLODIPINE BESYLATE 10 MG T: 10 | 90 days supply | Qty: 90 | Fill #0

## 2017-07-09 ENCOUNTER — Encounter: Payer: Self-pay | Admitting: Obstetrics and Gynecology

## 2017-07-09 ENCOUNTER — Ambulatory Visit (INDEPENDENT_AMBULATORY_CARE_PROVIDER_SITE_OTHER): Payer: 59 | Admitting: Obstetrics and Gynecology

## 2017-07-09 VITALS — BP 118/74 | HR 110 | Ht 65.5 in | Wt 195.6 lb

## 2017-07-09 DIAGNOSIS — D649 Anemia, unspecified: Secondary | ICD-10-CM | POA: Diagnosis not present

## 2017-07-09 DIAGNOSIS — Z9889 Other specified postprocedural states: Secondary | ICD-10-CM

## 2017-07-09 LAB — CBC
Hematocrit: 36.6 % (ref 34.0–46.6)
Hemoglobin: 12.1 g/dL (ref 11.1–15.9)
MCH: 25.2 pg — AB (ref 26.6–33.0)
MCHC: 33.1 g/dL (ref 31.5–35.7)
MCV: 76 fL — AB (ref 79–97)
PLATELETS: 317 10*3/uL (ref 150–379)
RBC: 4.81 x10E6/uL (ref 3.77–5.28)
RDW: 18.3 % — AB (ref 12.3–15.4)
WBC: 10.1 10*3/uL (ref 3.4–10.8)

## 2017-07-09 NOTE — Progress Notes (Signed)
GYNECOLOGY  VISIT   HPI: 36 y.o.   Single  African American  female   [id number] with Patient's last menstrual period was 03/11/2017 (exact date).   here for 6 week follow up [employer] Bilateral SALPINGECTOMY (Bilateral Abdomen).    States she has nerve pain in her skin.  Also has numbness. Now is able to wear jeans.   BMs are not regular.  No vaginal spotting.   Barkley Boards is Feb 20, 2017.   GYNECOLOGIC HISTORY: Patient's last menstrual period was 03/11/2017 (exact date). Contraception:  Hysterectomy Menopausal hormone therapy:  n/a Last mammogram:  n/a Last pap smear:   02-19-17 neg HPV HR neg                               05-15-14 Neg OB History    Gravida Para Term Preterm AB Living   1 1 1     1    SAB TAB Ectopic Multiple Live Births                     Patient Active Problem List   Diagnosis Date Noted  . Status post total abdominal hysterectomy 06/01/2017  . Hyperparathyroidism, primary ([place]) 10/28/2016  . Thyroid nodule 10/28/2016    Past Medical History:  Diagnosis Date  . Abnormal uterine bleeding   . Anemia    history of  . Diabetes mellitus without complication ([place])   . Elevated hemoglobin A1c   . Elevated liver function tests 05/28/2016   Resolved July 2018.  Marland Kitchen Fatty liver   . GERD (gastroesophageal reflux disease)   . Hypercalcemia due to hyperthyroidism   . Hyperparathyroidism, primary [facility])    endocrinologist-  dr Loanne Drilling  . Hypertension   . Thyroid nodule    per endocrinoloigst note (dr ellision)  secondary to primary hyperparathyroidism  . Uterine fibroid     Past Surgical History:  Procedure Laterality Date  . ABDOMINAL HYSTERECTOMY    . HYSTERECTOMY ABDOMINAL WITH SALPINGECTOMY Bilateral 06/01/2017   Procedure: HYSTERECTOMY ABDOMINAL WITH Bilateral SALPINGECTOMY;  Surgeon: Nunzio Cobbs, MD;  Location: [facility];  Service: Gynecology;  Laterality: Bilateral;  . NO PAST SURGERIES      Current Outpatient  Prescriptions  Medication Sig Dispense Refill  . amLODipine (NORVASC) 10 MG tablet Take 10 mg by mouth daily.   1  . losartan (COZAAR) 50 MG tablet Take 25 mg by mouth daily.   0  . metFORMIN (GLUCOPHAGE-XR) 500 MG 24 hr tablet Take 500 mg by mouth 2 (two) times daily.   0  . omeprazole (PRILOSEC) 20 MG capsule Take 20 mg by mouth daily as needed (for acid reflux/indigestion.).     Marland Kitchen VITAMIN E HIGH POTENCY 400 units capsule Take 400 Units by mouth daily.   11   No current facility-administered medications for this visit.      ALLERGIES: Patient has no known allergies.  Family History  Problem Relation Age of Onset  . Stroke Mother   . Hypertension Mother   . Breast cancer Sister 59       A & W.  Genetic testing negative.  . Diabetes Paternal Grandmother   . Hypercalcemia Neg Hx     Social History   Social History  . Marital status: Single    Spouse name: N/A  . Number of children: N/A  . Years of education: N/A   Occupational History  . Not  on file.   Social History Main Topics  . Smoking status: Never Smoker  . Smokeless tobacco: Never Used  . Alcohol use 1.2 oz/week    2 Standard drinks or equivalent per week     Comment: occasional  . Drug use: No  . Sexual activity: Yes    Partners: Male    Birth control/ protection: Condom, Surgical     Comment: Hysterectomy   Other Topics Concern  . Not on file   Social History Narrative  . No narrative on file    ROS:  Pertinent items are noted in HPI.  PHYSICAL EXAMINATION:    BP 118/74 (BP Location: Right Arm, Patient Position: Sitting, Cuff Size: Large)   Pulse (!) 110   Ht 5' 5.5" (1.664 m)   Wt 195 lb 9.6 oz (88.7 kg)   LMP 03/11/2017 (Exact Date)   BMI 32.05 kg/m     General appearance: alert, cooperative and appears stated age   Abdomen: Pfannenstiel well healed. Abdomen is soft, non-tender, no masses,  no organomegaly    Pelvic: External genitalia:  no lesions              Urethra:  normal appearing  urethra with no masses, tenderness or lesions              Bartholins and Skenes: normal                 Vagina: normal appearing vagina with normal color and discharge, no lesions              Cervix: absent.                Bimanual Exam:  Uterus:  Absent.              Adnexa: no mass, fullness, tenderness              Chaperone was present for exam.  ASSESSMENT  Status post TAH/bilateral salpingectomy.  Doing well.  PLAN  Check CBC.  Return to work 07/13/17.  Letter written.  Return to all normal activities.  Annual exam in May 2019.   An After Visit Summary was printed and given to the patient.

## 2017-07-10 ENCOUNTER — Ambulatory Visit: Payer: 59 | Admitting: Obstetrics and Gynecology

## 2017-07-24 MED FILL — LOSARTAN POTASSIUM 50 MG TA: 50 | 90 days supply | Qty: 45 | Fill #1

## 2017-09-28 MED FILL — METFORMIN HCL ER 500 MG TAB: 500 | 90 days supply | Qty: 180 | Fill #0

## 2017-10-19 MED FILL — LOSARTAN POTASSIUM 50 MG TA: 50 | 90 days supply | Qty: 45 | Fill #0

## 2017-10-19 MED FILL — AMLODIPINE BESYLATE 10 MG T: 10 | 90 days supply | Qty: 90 | Fill #0

## 2017-12-30 MED FILL — METFORMIN HCL ER 500 MG TAB: 500 | 90 days supply | Qty: 180 | Fill #1

## 2018-01-13 DIAGNOSIS — Z0000 Encounter for general adult medical examination without abnormal findings: Secondary | ICD-10-CM | POA: Diagnosis not present

## 2018-01-13 DIAGNOSIS — E119 Type 2 diabetes mellitus without complications: Secondary | ICD-10-CM | POA: Diagnosis not present

## 2018-01-13 DIAGNOSIS — I10 Essential (primary) hypertension: Secondary | ICD-10-CM | POA: Diagnosis not present

## 2018-01-13 DIAGNOSIS — R945 Abnormal results of liver function studies: Secondary | ICD-10-CM | POA: Diagnosis not present

## 2018-01-13 DIAGNOSIS — Z136 Encounter for screening for cardiovascular disorders: Secondary | ICD-10-CM | POA: Diagnosis not present

## 2018-01-22 MED FILL — LOSARTAN POTASSIUM 50 MG TA: 50 | 90 days supply | Qty: 45 | Fill #0

## 2018-01-22 MED FILL — AMLODIPINE BESYLATE 10 MG T: 10 | 90 days supply | Qty: 90 | Fill #0

## 2018-04-13 MED FILL — METFORMIN HCL ER 500 MG TAB: 500 | 90 days supply | Qty: 180 | Fill #0

## 2018-04-14 ENCOUNTER — Encounter: Payer: Self-pay | Admitting: Emergency Medicine

## 2018-04-14 ENCOUNTER — Ambulatory Visit (INDEPENDENT_AMBULATORY_CARE_PROVIDER_SITE_OTHER): Payer: Self-pay | Admitting: Emergency Medicine

## 2018-04-14 VITALS — BP 170/94 | HR 84 | Temp 98.3°F | Wt 197.4 lb

## 2018-04-14 DIAGNOSIS — H6123 Impacted cerumen, bilateral: Secondary | ICD-10-CM

## 2018-04-14 NOTE — Progress Notes (Signed)
   Subjective:    Patient ID: [name] [name], female    DOB: 10/11/1980, 37 y.o.   MRN: [id number]  HPI [name] [name] is a 37 y.o. female presenting to [facility] with c/o Right ear fullness for 2 days, mild in severity but gradually worsening.  Hx of cerumen impaction in the past, for which she needed her ears flushed.  She has tried peroxide at home with no relief.  Denies other symptoms such as congestion, pain, dizziness, HA, sore throat, or cough.  No recent swimming.    Review of Systems  HENT: Positive for ear pain (fullness, Right) and hearing loss (Right-muffled). Negative for congestion and sore throat.   Respiratory: Negative for cough.        Objective:   Physical Exam  Constitutional: She is oriented to person, place, and time. She appears well-developed and well-nourished.  HENT:  Head: Normocephalic and atraumatic.  Right Ear: Tympanic membrane is not erythematous and not bulging.  Left Ear: Tympanic membrane is not erythematous and not bulging.  Bilateral cerumen impaction.  Normal TMs after removal of cerumen.   Eyes: EOM are normal.  Neck: Normal range of motion.  Cardiovascular: Normal rate.  Pulmonary/Chest: Effort normal. No respiratory distress.  Musculoskeletal: Normal range of motion.  Neurological: She is alert and oriented to person, place, and time.  Skin: Skin is warm and dry.  Psychiatric: She has a normal mood and affect. Her behavior is normal.  Nursing note and vitals reviewed.     Assessment & Plan:   Bilateral cerumen impaction without underlying infection.  Cerumen removed from both ears using warm water and peroxide mixture.  Immediate relief of "fullness" sensation. No immediate complications.  Advised pt to use OTC Debrox for recurrent cerumen build up. F/u with PCP as needed.

## 2018-04-16 MED FILL — LOSARTAN POTASSIUM 50 MG TA: 50 | 90 days supply | Qty: 45 | Fill #1

## 2018-05-03 MED FILL — AMLODIPINE BESYLATE 10 MG T: 10 | 90 days supply | Qty: 90 | Fill #1

## 2018-07-14 DIAGNOSIS — Z0100 Encounter for examination of eyes and vision without abnormal findings: Secondary | ICD-10-CM | POA: Diagnosis not present

## 2018-07-14 MED FILL — metFORMIN HCL ER 500 MG TB2: 500 | 30 days supply | Qty: 60 | Fill #0

## 2018-07-14 MED FILL — LOSARTAN POTASSIUM 50 MG TA: 50 | 30 days supply | Qty: 15 | Fill #0

## 2018-08-02 MED FILL — AMLODIPINE BESYLATE 10 MG T: 10 | 30 days supply | Qty: 30 | Fill #0

## 2018-08-16 DIAGNOSIS — R945 Abnormal results of liver function studies: Secondary | ICD-10-CM | POA: Diagnosis not present

## 2018-08-16 DIAGNOSIS — I10 Essential (primary) hypertension: Secondary | ICD-10-CM | POA: Diagnosis not present

## 2018-08-16 DIAGNOSIS — E119 Type 2 diabetes mellitus without complications: Secondary | ICD-10-CM | POA: Diagnosis not present

## 2018-08-17 MED FILL — LOSARTAN POTASSIUM 50 MG TA: 50 | 90 days supply | Qty: 45 | Fill #0

## 2018-08-17 MED FILL — metFORMIN HCL ER 500 MG TB2: 500 | 90 days supply | Qty: 180 | Fill #0

## 2018-09-07 MED FILL — AMLODIPINE BESYLATE 10 MG T: 10 | 90 days supply | Qty: 90 | Fill #0

## 2018-11-16 MED FILL — metFORMIN HCL ER 500 MG TB2: 500 | 90 days supply | Qty: 180 | Fill #1

## 2018-11-16 MED FILL — LOSARTAN POTASSIUM 50 MG TA: 50 | 90 days supply | Qty: 45 | Fill #1

## 2018-12-20 MED FILL — AMLODIPINE BESYLATE 10 MG T: 10 | 90 days supply | Qty: 90 | Fill #1

## 2019-03-03 MED FILL — LOSARTAN POTASSIUM 50 MG TA: 50 | 90 days supply | Qty: 45 | Fill #0

## 2019-03-03 MED FILL — metFORMIN HCL ER 500 MG TB2: 500 | 90 days supply | Qty: 180 | Fill #0

## 2019-04-04 MED FILL — AMLODIPINE BESYLATE 10 MG T: 10 | 90 days supply | Qty: 90 | Fill #0

## 2019-05-24 ENCOUNTER — Telehealth: Payer: Self-pay | Admitting: Obstetrics and Gynecology

## 2019-05-24 NOTE — Telephone Encounter (Signed)
Appointment Request From: [name] [name]    With Provider: Arloa Koh, MD [facility]]    Preferred Date Range: 05/31/2019 - 06/01/2019    Preferred Times: Monday Morning, Tuesday Morning, Wednesday Morning, Thursday Morning, Friday Morning    Reason for visit: Request an Appointment    Comments:  Lump under left breast

## 2019-05-24 NOTE — Telephone Encounter (Signed)
Call to patient. Patient states she noticed a lump "under her left breast yesterday." States it is tender, but that "my breasts get sore once a month." Patient has had a TAH. Denies fever, rash or nipple discharge. States she has never had a mammogram. RN advised OV recommended. Patient agreeable. Patient scheduled for 05-30-2019 at 0900. 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 Precautions reviewed with patient to return call prior to Monday if develops fever or pain worsens. Patient agreeable.   Routing to provider and will close encounter.

## 2019-05-25 NOTE — Progress Notes (Signed)
GYNECOLOGY  VISIT   HPI: 37 y.o.   Single  African American  female   [id number] with Patient's last menstrual period was 03/11/2017 (exact date).   here for left breast lump.   Noticed the lump last week. Was tender to touch, but not now.  Has tenderness on a monthly basis.   Not taking any hormonal treatment.   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   GYNECOLOGIC HISTORY: Patient's last menstrual period was 03/11/2017 (exact date). Contraception: Hysterectomy Menopausal hormone therapy:  none Last mammogram:  none Last pap smear: 02-19-17 Neg:Neg HR HPV                             05-15-14 Neg:Neg HR HPV OB History    Gravida  1   Para  1   Term  1   Preterm      AB      Living  1     SAB      TAB      Ectopic      Multiple      Live Births                 Patient Active Problem List   Diagnosis Date Noted  . Status post total abdominal hysterectomy 06/01/2017  . Hyperparathyroidism, primary ([place]) 10/28/2016  . Thyroid nodule 10/28/2016    Past Medical History:  Diagnosis Date  . Abnormal uterine bleeding   . Anemia    history of  . Diabetes mellitus without complication ([place])   . Elevated hemoglobin A1c   . Elevated liver function tests 05/28/2016   Resolved July 2018.  Marland Kitchen Fatty liver   . GERD (gastroesophageal reflux disease)   . Hypercalcemia due to hyperthyroidism   . Hyperparathyroidism, primary [facility])    endocrinologist-  dr Loanne Drilling  . Hypertension   . Thyroid nodule    per endocrinoloigst note (dr ellision)  secondary to primary hyperparathyroidism  . Uterine fibroid     Past Surgical History:  Procedure Laterality Date  . ABDOMINAL HYSTERECTOMY    . HYSTERECTOMY ABDOMINAL WITH SALPINGECTOMY Bilateral 06/01/2017   Procedure: HYSTERECTOMY ABDOMINAL WITH Bilateral SALPINGECTOMY;  Surgeon: Nunzio Cobbs, MD;  Location: [facility];  Service: Gynecology;  Laterality: Bilateral;  . NO PAST SURGERIES      Current Outpatient  Medications  Medication Sig Dispense Refill  . amLODipine (NORVASC) 10 MG tablet Take 10 mg by mouth daily.   1  . losartan (COZAAR) 50 MG tablet Take 25 mg by mouth daily.   0  . metFORMIN (GLUCOPHAGE-XR) 500 MG 24 hr tablet Take 500 mg by mouth 2 (two) times daily.   0  . omeprazole (PRILOSEC) 20 MG capsule Take 20 mg by mouth daily as needed (for acid reflux/indigestion.).      No current facility-administered medications for this visit.      ALLERGIES: Patient has no known allergies.  Family History  Problem Relation Age of Onset  . Stroke Mother   . Hypertension Mother   . Breast cancer Sister 94       A & W.  Genetic testing negative.  . Diabetes Paternal Grandmother   . Hypercalcemia Neg Hx     Social History   Socioeconomic History  . Marital status: Single    Spouse name: Not on file  . Number of children: Not on file  . Years of education: Not on file  .  Highest education level: Not on file  Occupational History  . Not on file  Social Needs  . Financial resource strain: Not on file  . Food insecurity    Worry: Not on file    Inability: Not on file  . Transportation needs    Medical: Not on file    Non-medical: Not on file  Tobacco Use  . Smoking status: Never Smoker  . Smokeless tobacco: Never Used  Substance and Sexual Activity  . Alcohol use: Yes    Alcohol/week: 2.0 standard drinks    Types: 2 Standard drinks or equivalent per week    Comment: occasional  . Drug use: No  . Sexual activity: Yes    Partners: Male    Birth control/protection: Condom, Surgical    Comment: Hysterectomy  Lifestyle  . Physical activity    Days per week: Not on file    Minutes per session: Not on file  . Stress: Not on file  Relationships  . Social Herbalist on phone: Not on file    Gets together: Not on file    Attends religious service: Not on file    Active member of club or organization: Not on file    Attends meetings of clubs or organizations: Not  on file    Relationship status: Not on file  . Intimate partner violence    Fear of current or ex partner: Not on file    Emotionally abused: Not on file    Physically abused: Not on file    Forced sexual activity: Not on file  Other Topics Concern  . Not on file  Social History Narrative  . Not on file    Review of Systems  All other systems reviewed and are negative.   PHYSICAL EXAMINATION:    BP (!) 142/90 (Cuff Size: Large)   Pulse (!) 120   Temp (!) 97.2 F (36.2 C) (Temporal)   Ht 5' 5.5" (1.664 m)   Wt 210 lb (95.3 kg)   LMP 03/11/2017 (Exact Date)   BMI 34.41 kg/m     General appearance: alert, cooperative and appears stated age Head: Normocephalic, without obvious abnormality, atraumatic Neck: no adenopathy, supple, symmetrical, trachea midline and thyroid normal to inspection and palpation Lungs: clear to auscultation bilaterally Breasts: right -  normal appearance, no masses or tenderness, No nipple retraction or dimpling, No nipple discharge or bleeding, No axillary or supraclavicular adenopathy Left - normal appearance, 1.5 cm mass at 5:00, no tenderness, No nipple retraction or dimpling, No nipple discharge or bleeding, No axillary or supraclavicular adenopathy Heart: regular rate and rhythm   Chaperone was present for exam.  ASSESSMENT  Left breast mass.  FH of breast cancer in 1/2 sister with negative genetic testing.   PLAN  We discussed breast masses and possible etiologies.  Will schedule a bilateral dx mammogram and left breast US at the [facility].  Return for full annual exam.    An After Visit Summary was printed and given to the patient.  _15_____ minutes face to face time of which over 50% was spent in counseling.

## 2019-05-26 ENCOUNTER — Other Ambulatory Visit: Payer: Self-pay

## 2019-05-30 ENCOUNTER — Encounter: Payer: Self-pay | Admitting: Obstetrics and Gynecology

## 2019-05-30 ENCOUNTER — Other Ambulatory Visit: Payer: Self-pay | Admitting: Obstetrics and Gynecology

## 2019-05-30 ENCOUNTER — Other Ambulatory Visit: Payer: Self-pay

## 2019-05-30 ENCOUNTER — Ambulatory Visit: Payer: 59 | Admitting: Obstetrics and Gynecology

## 2019-05-30 ENCOUNTER — Telehealth: Payer: Self-pay | Admitting: Obstetrics and Gynecology

## 2019-05-30 VITALS — BP 142/90 | HR 120 | Temp 97.2°F | Ht 65.5 in | Wt 210.0 lb

## 2019-05-30 DIAGNOSIS — N6320 Unspecified lump in the left breast, unspecified quadrant: Secondary | ICD-10-CM | POA: Diagnosis not present

## 2019-05-30 NOTE — Telephone Encounter (Signed)
Please schedule a bilateral diagnostic mammogram and left breast US at [facility].   This will be her first mammogram.  She has a 1/2 sister with breast cancer.   She has a 1.5 cm mass in the inferior breast.

## 2019-05-30 NOTE — Telephone Encounter (Signed)
Call to [facility], spoke with Lilia Pro. Patient scheduled for bilateral diagnostic mammogram and left breast US on 06-02-2019 at 0900. RN advised would update patient of appointment date and time.

## 2019-05-30 NOTE — Patient Instructions (Signed)
We will call you with an appointment time for [facility].

## 2019-05-30 NOTE — Telephone Encounter (Signed)
Message left to return call to Triage Nurse at [phone].    

## 2019-06-01 NOTE — Telephone Encounter (Signed)
Message left to return call to Triage Nurse at [phone].    

## 2019-06-01 NOTE — Telephone Encounter (Signed)
Patient is returning call to triage nurse. °

## 2019-06-01 NOTE — Telephone Encounter (Signed)
Patient notified of date and time of Diagnostic MMG appointment at [facility] and agreeable.   Routing to provider and will close encounter.

## 2019-06-02 ENCOUNTER — Ambulatory Visit
Admission: RE | Admit: 2019-06-02 | Discharge: 2019-06-02 | Disposition: A | Discharge status: Home / Self Care | Payer: 59 | Source: Ambulatory Visit | Attending: Obstetrics and Gynecology | Admitting: Obstetrics and Gynecology

## 2019-06-02 ENCOUNTER — Other Ambulatory Visit: Payer: Self-pay | Admitting: Obstetrics and Gynecology

## 2019-06-02 ENCOUNTER — Other Ambulatory Visit: Payer: Self-pay

## 2019-06-02 ENCOUNTER — Ambulatory Visit (HOSPITAL_COMMUNITY): Admission: RE | Admit: 2019-06-02 | Payer: 59 | Source: Ambulatory Visit

## 2019-06-02 DIAGNOSIS — N6489 Other specified disorders of breast: Secondary | ICD-10-CM

## 2019-06-02 DIAGNOSIS — N6320 Unspecified lump in the left breast, unspecified quadrant: Secondary | ICD-10-CM

## 2019-06-02 DIAGNOSIS — N6310 Unspecified lump in the right breast, unspecified quadrant: Secondary | ICD-10-CM

## 2019-06-02 DIAGNOSIS — N6324 Unspecified lump in the left breast, lower inner quadrant: Secondary | ICD-10-CM | POA: Diagnosis not present

## 2019-06-02 DIAGNOSIS — R922 Inconclusive mammogram: Secondary | ICD-10-CM | POA: Diagnosis not present

## 2019-06-06 ENCOUNTER — Ambulatory Visit
Admission: RE | Admit: 2019-06-06 | Discharge: 2019-06-06 | Disposition: A | Discharge status: Home / Self Care | Payer: 59 | Source: Ambulatory Visit | Attending: Obstetrics and Gynecology | Admitting: Obstetrics and Gynecology

## 2019-06-06 ENCOUNTER — Other Ambulatory Visit: Payer: Self-pay

## 2019-06-06 DIAGNOSIS — N6310 Unspecified lump in the right breast, unspecified quadrant: Secondary | ICD-10-CM

## 2019-06-06 DIAGNOSIS — N6011 Diffuse cystic mastopathy of right breast: Secondary | ICD-10-CM | POA: Diagnosis not present

## 2019-06-06 DIAGNOSIS — N6312 Unspecified lump in the right breast, upper inner quadrant: Secondary | ICD-10-CM | POA: Diagnosis not present

## 2019-06-06 DIAGNOSIS — N6489 Other specified disorders of breast: Secondary | ICD-10-CM | POA: Diagnosis not present

## 2019-06-06 HISTORY — PX: BREAST BIOPSY: SHX20

## 2019-06-08 MED FILL — LOSARTAN POTASSIUM 50 MG TA: 50 | 90 days supply | Qty: 45 | Fill #1

## 2019-06-20 NOTE — Progress Notes (Signed)
38 y.o. [id number] Single African American female here for annual exam.    "Life is wonderful after hysterectomy."  She is having hot flashes every so often.   PCP:  Marda Stalker, PA-C  Patient's last menstrual period was 03/11/2017 (exact date).           Sexually active: Yes.    The current method of family planning is status post hysterectomy.    Exercising: No.  The patient does not participate in regular exercise at present. Smoker:  no  Health Maintenance: Pap: 02-19-17 Neg:Neg HR HPV, 05-15-14 Neg:Neg HR HPV.  Final pathology from hysterectomy showed normal cervix. History of abnormal Pap:  Possibly in her 45s MMG: Diag.Bil.w/US --focal asymmetry Rt.Br., probable fibroadenoma in Lt.Br.--SEE EPIC Colonoscopy:  never BMD:   n/a  Result  n/a TDaP:  Unsure.  Last done at [facility]. Gardasil:   no [id number] in pregnancy Hep C: never Screening Labs: PCP   reports that she has never smoked. She has never used smokeless tobacco. She reports current alcohol use of about 2.0 standard drinks of alcohol per week. She reports that she does not use drugs.  Past Medical History:  Diagnosis Date  . Abnormal uterine bleeding   . Anemia    history of  . Diabetes mellitus without complication ([place])   . Elevated hemoglobin A1c   . Elevated liver function tests 05/28/2016   Resolved July 2018.  Marland Kitchen Fatty liver   . GERD (gastroesophageal reflux disease)   . Hypercalcemia due to hyperthyroidism   . Hyperparathyroidism, primary [facility])    endocrinologist-  dr Loanne Drilling  . Hypertension   . Thyroid nodule    per endocrinoloigst note (dr ellision)  secondary to primary hyperparathyroidism  . Uterine fibroid     Past Surgical History:  Procedure Laterality Date  . ABDOMINAL HYSTERECTOMY    . HYSTERECTOMY ABDOMINAL WITH SALPINGECTOMY Bilateral 06/01/2017   Procedure: HYSTERECTOMY ABDOMINAL WITH Bilateral SALPINGECTOMY;  Surgeon: Nunzio Cobbs, MD;  Location: [facility];   Service: Gynecology;  Laterality: Bilateral;  . NO PAST SURGERIES      Current Outpatient Medications  Medication Sig Dispense Refill  . amLODipine (NORVASC) 10 MG tablet Take 10 mg by mouth daily.   1  . losartan (COZAAR) 50 MG tablet Take 25 mg by mouth daily.   0  . metFORMIN (GLUCOPHAGE-XR) 500 MG 24 hr tablet Take 500 mg by mouth 2 (two) times daily.   0  . omeprazole (PRILOSEC) 20 MG capsule Take 20 mg by mouth daily as needed (for acid reflux/indigestion.).      No current facility-administered medications for this visit.     Family History  Problem Relation Age of Onset  . Stroke Mother   . Hypertension Mother   . Breast cancer Sister 69       A & W.  Genetic testing negative.  . Diabetes Paternal Grandmother   . Hypercalcemia Neg Hx     Review of Systems  Constitutional: Negative.   HENT: Negative.   Eyes: Negative.   Respiratory: Negative.   Cardiovascular: Negative.   Gastrointestinal: Negative.   Endocrine: Negative.   Genitourinary: Negative.   Musculoskeletal: Negative.   Skin: Negative.   Allergic/Immunologic: Negative.   Neurological: Negative.   Hematological: Negative.   Psychiatric/Behavioral: Negative.     Exam:   BP 140/82 (BP Location: Right Arm, Patient Position: Sitting, Cuff Size: Large)   Pulse (!) 108   Temp 97.6 F (36.4 C) (Temporal)  Resp 14   Ht 5' 5.5" (1.664 m)   Wt 210 lb (95.3 kg)   LMP 03/11/2017 (Exact Date)   BMI 34.41 kg/m     General appearance: alert, cooperative and appears stated age Head: normocephalic, without obvious abnormality, atraumatic Neck: no adenopathy, supple, symmetrical, trachea midline and thyroid normal to inspection and palpation Lungs: clear to auscultation bilaterally Breasts: normal appearance, no masses or tenderness, No nipple retraction or dimpling, No nipple discharge or bleeding, No axillary adenopathy Heart: regular rate and rhythm Abdomen: soft, non-tender; no masses, no  organomegaly Extremities: extremities normal, atraumatic, no cyanosis or edema Skin: skin color, texture, turgor normal. No rashes or lesions Lymph nodes: cervical, supraclavicular, and axillary nodes normal. Neurologic: grossly normal  Pelvic: External genitalia:  no lesions              No abnormal inguinal nodes palpated.              Urethra:  normal appearing urethra with no masses, tenderness or lesions              Bartholins and Skenes: normal                 Vagina: normal appearing vagina with normal color and discharge, no lesions              Cervix: absent              Pap taken: No. Bimanual Exam:  Uterus:  absent              Adnexa: no mass, fullness, tenderness              Rectal exam: Yes.  .  Confirms.              Anus:  normal sphincter tone, no lesions  Chaperone was present for exam.  Assessment:   Well woman visit with normal exam. Status post TAH/bilateral salpingectomy. Status post right breast biopsy with fibrocystic change, calcification, PASH. Probable left breast fibroadenoma. Hot flashes.   Plan: Dx [place] of left breast in Feb/March 2021. Self breast awareness reviewed. Pap and HR HPV as above. Guidelines for Calcium, Vitamin D, regular exercise program including cardiovascular and weight bearing exercise. Will check FSH, E2, TSH, free T3 and free T4.  We discussed potential estrogen therapy in transdermal form if labs indicate menopause. She will do all of her other labs through her PCP.  She will check on her TDap status.  Flu vaccine through work.  Follow up annually and prn.   After visit summary provided.

## 2019-06-21 ENCOUNTER — Ambulatory Visit: Payer: 59 | Admitting: Obstetrics and Gynecology

## 2019-06-21 ENCOUNTER — Encounter: Payer: Self-pay | Admitting: Obstetrics and Gynecology

## 2019-06-21 ENCOUNTER — Other Ambulatory Visit: Payer: Self-pay

## 2019-06-21 VITALS — BP 140/82 | HR 108 | Temp 97.6°F | Resp 14 | Ht 65.5 in | Wt 210.0 lb

## 2019-06-21 DIAGNOSIS — Z01419 Encounter for gynecological examination (general) (routine) without abnormal findings: Secondary | ICD-10-CM | POA: Diagnosis not present

## 2019-06-21 DIAGNOSIS — R232 Flushing: Secondary | ICD-10-CM

## 2019-06-21 MED FILL — metFORMIN HCL ER 500 MG TB2: 500 | 90 days supply | Qty: 180 | Fill #0

## 2019-06-21 NOTE — Patient Instructions (Signed)

## 2019-06-22 LAB — ESTRADIOL: Estradiol: 78.2 pg/mL

## 2019-06-22 LAB — T4, FREE: Free T4: 1.19 ng/dL (ref 0.82–1.77)

## 2019-06-22 LAB — T3, FREE: T3, Free: 3.5 pg/mL (ref 2.0–4.4)

## 2019-06-22 LAB — TSH: TSH: 1.1 u[IU]/mL (ref 0.450–4.500)

## 2019-06-22 LAB — FOLLICLE STIMULATING HORMONE: FSH: 3.3 m[IU]/mL

## 2019-07-11 DIAGNOSIS — K219 Gastro-esophageal reflux disease without esophagitis: Secondary | ICD-10-CM | POA: Diagnosis not present

## 2019-07-11 DIAGNOSIS — Z13220 Encounter for screening for lipoid disorders: Secondary | ICD-10-CM | POA: Diagnosis not present

## 2019-07-11 DIAGNOSIS — Z0000 Encounter for general adult medical examination without abnormal findings: Secondary | ICD-10-CM | POA: Diagnosis not present

## 2019-07-11 DIAGNOSIS — I10 Essential (primary) hypertension: Secondary | ICD-10-CM | POA: Diagnosis not present

## 2019-07-11 DIAGNOSIS — G4700 Insomnia, unspecified: Secondary | ICD-10-CM | POA: Diagnosis not present

## 2019-07-11 DIAGNOSIS — E119 Type 2 diabetes mellitus without complications: Secondary | ICD-10-CM | POA: Diagnosis not present

## 2019-07-11 MED FILL — traZODone HCL 50 MG TABS: 50 | 30 days supply | Qty: 30 | Fill #0

## 2019-07-11 MED FILL — FAMOTIDINE 40 MG TABLET: 40 | 90 days supply | Qty: 90 | Fill #0

## 2019-07-11 MED FILL — AMLODIPINE BESYLATE 10 MG T: 10 | 90 days supply | Qty: 90 | Fill #0

## 2019-08-11 MED FILL — traZODone HCL 50 MG TABS: 50 | 30 days supply | Qty: 30 | Fill #1

## 2019-08-31 ENCOUNTER — Other Ambulatory Visit: Payer: Self-pay

## 2019-08-31 DIAGNOSIS — Z20822 Contact with and (suspected) exposure to covid-19: Secondary | ICD-10-CM

## 2019-09-01 LAB — NOVEL CORONAVIRUS, NAA: SARS-CoV-2, NAA: DETECTED — AB

## 2019-09-13 MED FILL — metFORMIN HCL ER 500 MG TB2: 500 | 90 days supply | Qty: 180 | Fill #0

## 2019-09-14 MED FILL — LOSARTAN POTASSIUM 50 MG TA: 50 | 90 days supply | Qty: 45 | Fill #0

## 2019-09-20 MED FILL — traZODone HCL 50 MG TABS: 50 | 30 days supply | Qty: 30 | Fill #0

## 2019-10-13 MED FILL — AMLODIPINE BESYLATE 10 MG T: 10 | 90 days supply | Qty: 90 | Fill #1

## 2019-10-24 MED FILL — traZODone HCL 50 MG TABS: 50 | 30 days supply | Qty: 30 | Fill #1

## 2019-11-25 MED FILL — traZODone HCL 50 MG TABS: 50 | 30 days supply | Qty: 30 | Fill #2

## 2019-11-28 MED FILL — FAMOTIDINE 40 MG TABS: 40 | 90 days supply | Qty: 90 | Fill #0

## 2019-12-05 ENCOUNTER — Other Ambulatory Visit: Payer: Self-pay | Admitting: Obstetrics and Gynecology

## 2019-12-05 ENCOUNTER — Other Ambulatory Visit: Payer: Self-pay

## 2019-12-05 ENCOUNTER — Ambulatory Visit
Admission: RE | Admit: 2019-12-05 | Discharge: 2019-12-05 | Disposition: A | Discharge status: Home / Self Care | Payer: 59 | Source: Ambulatory Visit | Attending: Obstetrics and Gynecology | Admitting: Obstetrics and Gynecology

## 2019-12-05 DIAGNOSIS — N6022 Fibroadenosis of left breast: Secondary | ICD-10-CM | POA: Diagnosis not present

## 2019-12-05 DIAGNOSIS — N6320 Unspecified lump in the left breast, unspecified quadrant: Secondary | ICD-10-CM

## 2019-12-12 MED FILL — LOSARTAN POTASSIUM 50 MG TA: 50 | 90 days supply | Qty: 45 | Fill #1

## 2019-12-28 MED FILL — metFORMIN HCL ER 500 MG TB2: 500 | 90 days supply | Qty: 180 | Fill #1

## 2019-12-28 MED FILL — traZODone HCL 50 MG TABS: 50 | 30 days supply | Qty: 30 | Fill #3

## 2020-01-16 MED FILL — AMLODIPINE BESYLATE 10 MG T: 10 | 90 days supply | Qty: 90 | Fill #0

## 2020-02-02 MED FILL — traZODone HCL 50 MG TABS: 50 | 30 days supply | Qty: 30 | Fill #4

## 2020-03-28 MED FILL — LOSARTAN POTASSIUM 50 MG TA: 50 | 90 days supply | Qty: 45 | Fill #0

## 2020-04-30 MED FILL — metFORMIN HCL ER 500 MG TB2: 500 | 90 days supply | Qty: 180 | Fill #0

## 2020-04-30 MED FILL — traZODone HCL 50 MG TABS: 50 | 90 days supply | Qty: 90 | Fill #0

## 2020-05-14 MED FILL — AMLODIPINE BESYLATE 10 MG T: 10 | 90 days supply | Qty: 90 | Fill #1

## 2020-06-07 ENCOUNTER — Ambulatory Visit
Admission: RE | Admit: 2020-06-07 | Discharge: 2020-06-07 | Disposition: A | Discharge status: Home / Self Care | Payer: 59 | Source: Ambulatory Visit | Attending: Obstetrics and Gynecology | Admitting: Obstetrics and Gynecology

## 2020-06-07 ENCOUNTER — Other Ambulatory Visit: Payer: Self-pay

## 2020-06-07 ENCOUNTER — Other Ambulatory Visit: Payer: Self-pay | Admitting: Obstetrics and Gynecology

## 2020-06-07 DIAGNOSIS — N6320 Unspecified lump in the left breast, unspecified quadrant: Secondary | ICD-10-CM

## 2020-06-07 DIAGNOSIS — R922 Inconclusive mammogram: Secondary | ICD-10-CM | POA: Diagnosis not present

## 2020-06-07 DIAGNOSIS — N6324 Unspecified lump in the left breast, lower inner quadrant: Secondary | ICD-10-CM | POA: Diagnosis not present

## 2020-06-21 ENCOUNTER — Ambulatory Visit
Admission: RE | Admit: 2020-06-21 | Discharge: 2020-06-21 | Disposition: A | Discharge status: Home / Self Care | Payer: 59 | Source: Ambulatory Visit | Attending: Obstetrics and Gynecology | Admitting: Obstetrics and Gynecology

## 2020-06-21 ENCOUNTER — Other Ambulatory Visit: Payer: Self-pay

## 2020-06-21 DIAGNOSIS — N6321 Unspecified lump in the left breast, upper outer quadrant: Secondary | ICD-10-CM | POA: Diagnosis not present

## 2020-06-21 DIAGNOSIS — N6320 Unspecified lump in the left breast, unspecified quadrant: Secondary | ICD-10-CM

## 2020-06-21 DIAGNOSIS — D242 Benign neoplasm of left breast: Secondary | ICD-10-CM | POA: Diagnosis not present

## 2020-07-09 ENCOUNTER — Other Ambulatory Visit (HOSPITAL_COMMUNITY): Payer: Self-pay | Admitting: Family Medicine

## 2020-07-09 MED FILL — FAMOTIDINE 40 MG TABS: 40 | 90 days supply | Qty: 90 | Fill #0

## 2020-07-11 ENCOUNTER — Other Ambulatory Visit (HOSPITAL_COMMUNITY): Payer: Self-pay | Admitting: Family Medicine

## 2020-07-11 DIAGNOSIS — H6123 Impacted cerumen, bilateral: Secondary | ICD-10-CM | POA: Diagnosis not present

## 2020-07-11 DIAGNOSIS — G4700 Insomnia, unspecified: Secondary | ICD-10-CM | POA: Diagnosis not present

## 2020-07-11 DIAGNOSIS — Z0000 Encounter for general adult medical examination without abnormal findings: Secondary | ICD-10-CM | POA: Diagnosis not present

## 2020-07-11 DIAGNOSIS — I10 Essential (primary) hypertension: Secondary | ICD-10-CM | POA: Diagnosis not present

## 2020-07-11 DIAGNOSIS — E119 Type 2 diabetes mellitus without complications: Secondary | ICD-10-CM | POA: Diagnosis not present

## 2020-07-11 DIAGNOSIS — K219 Gastro-esophageal reflux disease without esophagitis: Secondary | ICD-10-CM | POA: Diagnosis not present

## 2020-07-11 MED FILL — LOSARTAN POTASSIUM 50 MG TA: 50 | 90 days supply | Qty: 45 | Fill #0

## 2020-07-11 MED FILL — traZODone HCL 100 MG TABS: 100 | 90 days supply | Qty: 90 | Fill #0

## 2020-07-17 DIAGNOSIS — R21 Rash and other nonspecific skin eruption: Secondary | ICD-10-CM | POA: Diagnosis not present

## 2020-08-10 ENCOUNTER — Other Ambulatory Visit (HOSPITAL_COMMUNITY): Payer: Self-pay | Admitting: Family Medicine

## 2020-08-10 MED FILL — metFORMIN HCL ER 500 MG TB2: 500 | 90 days supply | Qty: 180 | Fill #0

## 2020-08-14 ENCOUNTER — Other Ambulatory Visit (HOSPITAL_COMMUNITY): Payer: Self-pay | Admitting: Family Medicine

## 2020-08-14 MED FILL — ROSUVASTATIN CALCIUM 5 MG T: 5 | 90 days supply | Qty: 90 | Fill #0

## 2020-08-21 MED FILL — AMLODIPINE BESYLATE 10 MG T: 10 | 90 days supply | Qty: 90 | Fill #0

## 2020-09-16 IMAGING — US ULTRASOUND LEFT BREAST LIMITED
1 series · 6 of 6 positions shown · non-contrast
Comparison: Baseline

CLINICAL DATA: Palpable abnormality in the LEFT breast noted 1 week
ago.

EXAM:
DIGITAL DIAGNOSTIC BILATERAL MAMMOGRAM WITH CAD AND TOMO
ULTRASOUND BILATERAL BREAST

[Series 1: ultrasound left breast limited · 0.06mm/px · 6 of 6 slices shown]
[im 1/6]
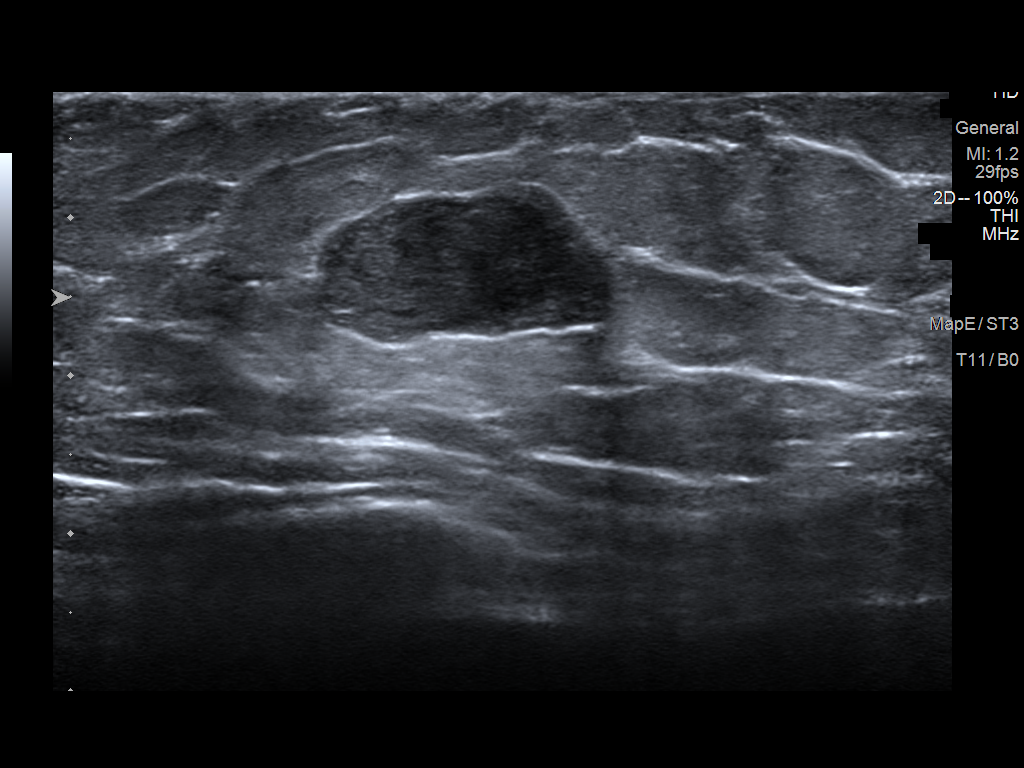
[im 2/6]
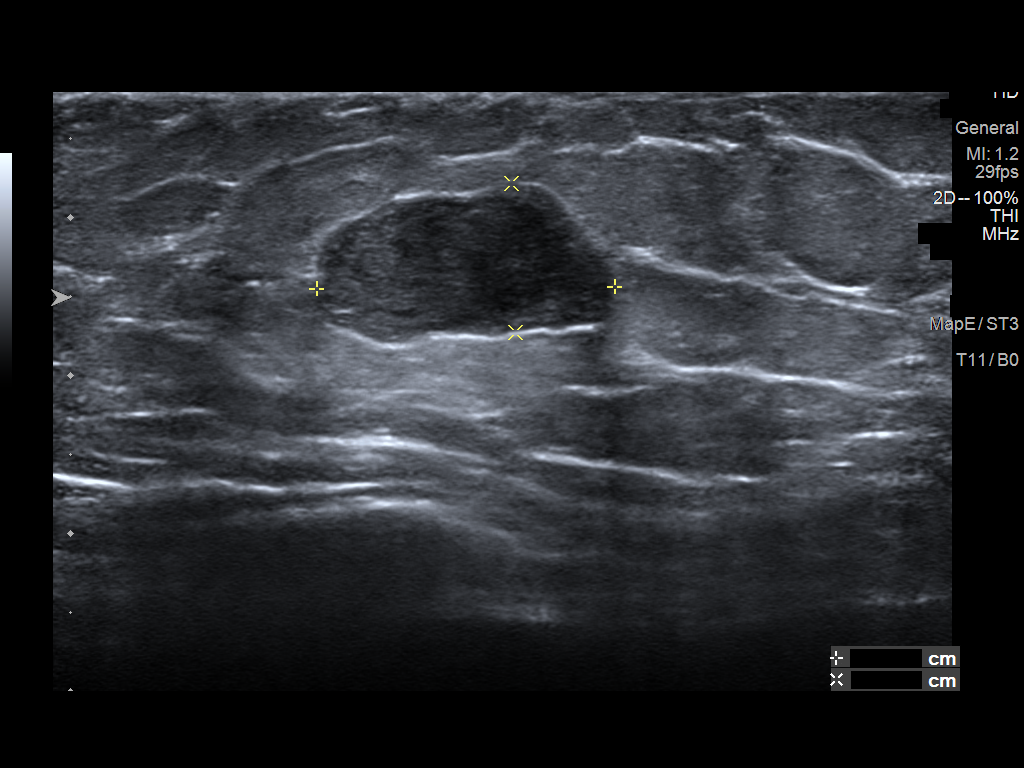
[im 3/6]
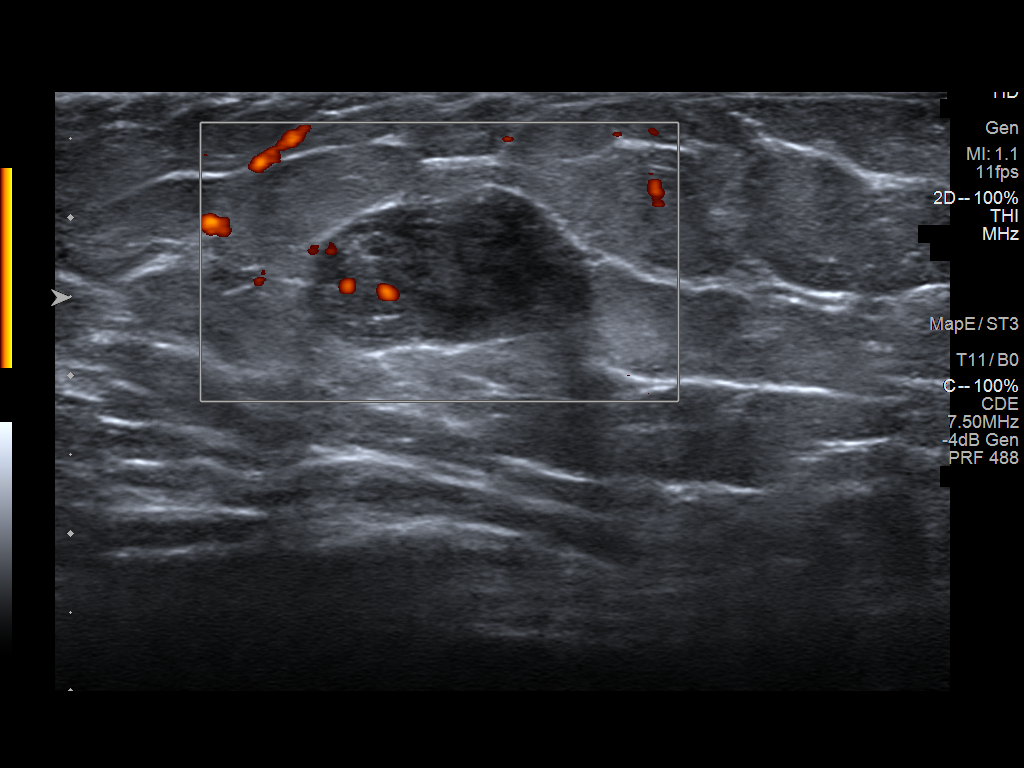
[im 4/6]
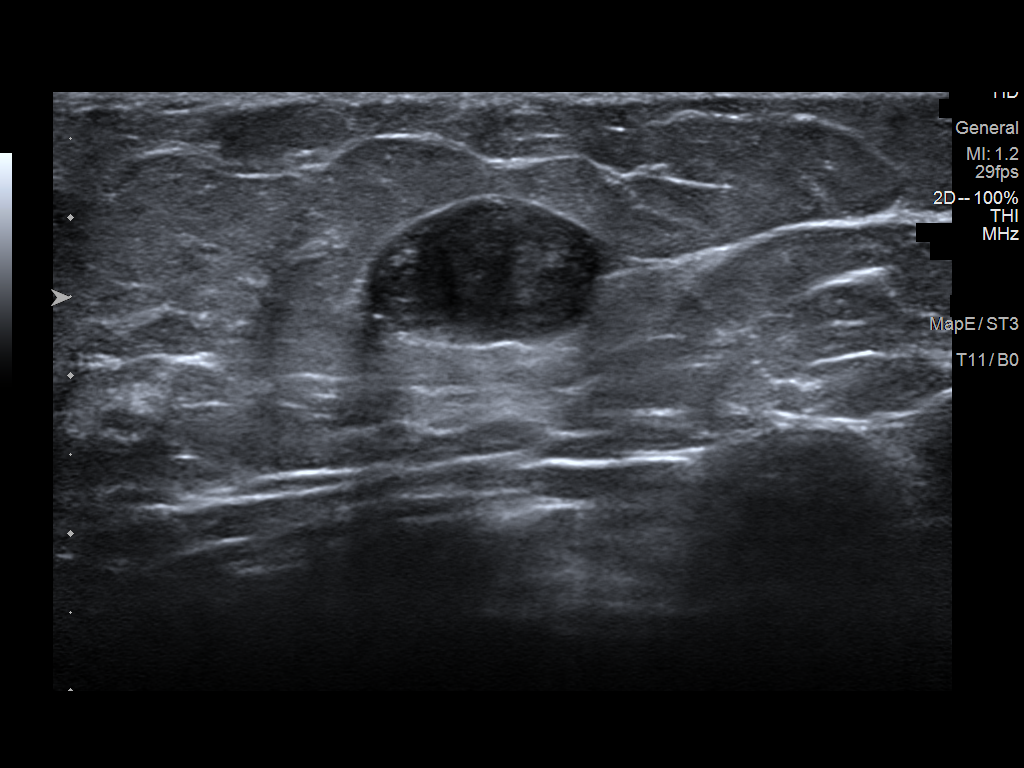
[im 5/6]
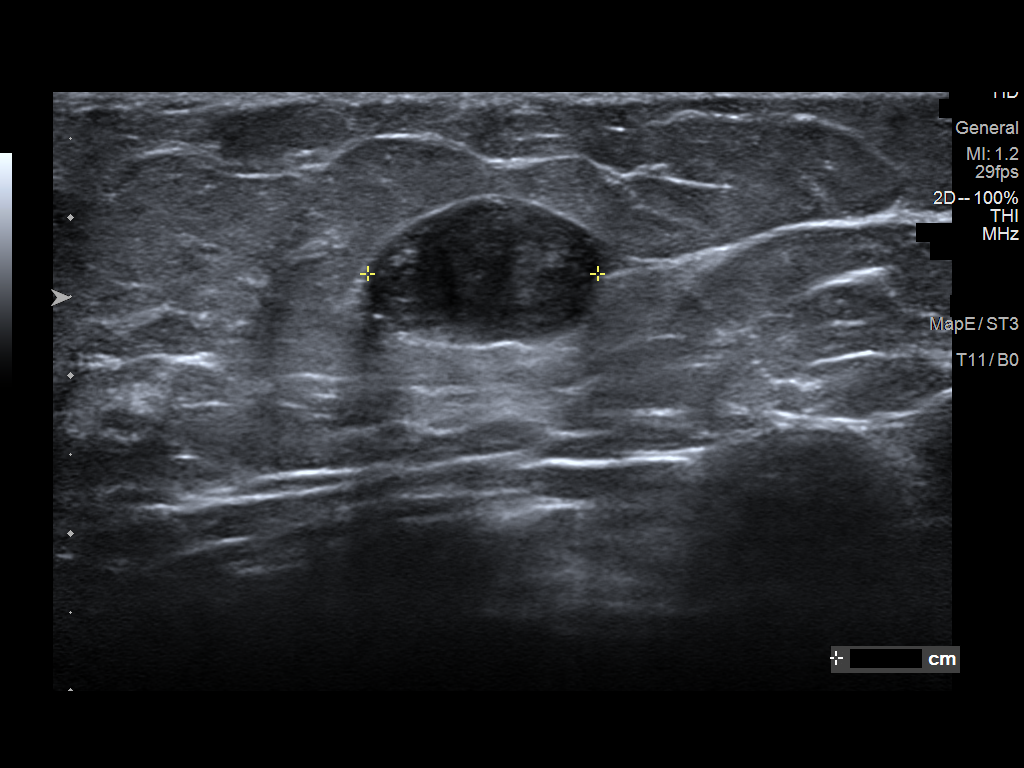
[im 6/6]
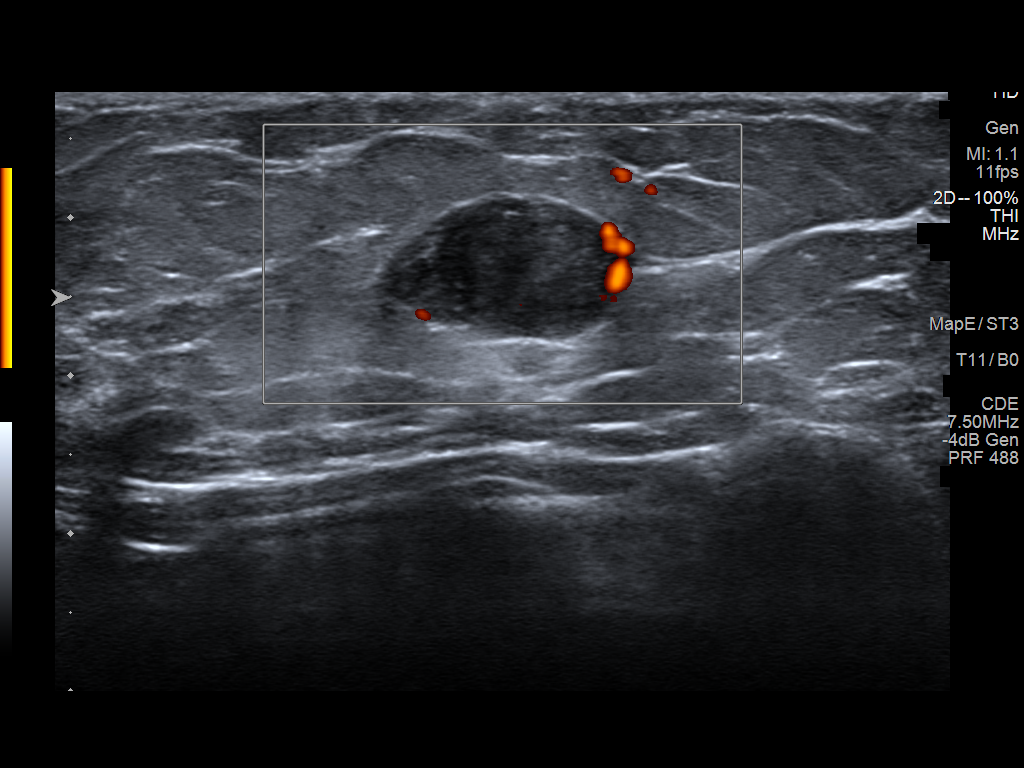

[6 of 6 positions shown; findings below may reference images not displayed]

ACR Breast Density Category c: The breast tissue is heterogeneously
dense, which may obscure small masses.
FINDINGS: Right breast:

Mammogram: Focal asymmetry is identified in the UPPER INNER QUADRANT
of the RIGHT breast, confirmed on additional views, and further
evaluated sonographically. No suspicious distortion or
microcalcifications. Mammographic images were processed with CAD.

Physical Exam: I palpate no abnormality in the UPPER-INNER QUADRANT
of the RIGHT breast.

Ultrasound: Targeted ultrasound is performed, showing focal
hyperechoic tissue containing hypoechoic tubular structures in the
12:30 o'clock location of the RIGHT breast 3 centimeters from the
nipple. There is no associated internal blood flow. These
architecture of tissue in this region is different from the
surrounding breast parenchyma. In BBs placed on this abnormality and
spot tangential view confirms that represents the mammographic
finding.

Evaluation of the RIGHT axilla is negative for adenopathy.

Left breast:

Mammogram: There is a circumscribed isodense mass in the LOWER INNER
QUADRANT of the LEFT breast further evaluated with ultrasound.
Mammographic images were processed with CAD.

Physical Exam: I palpate a discrete mobile oval mass in the LOWER
INNER QUADRANT of the LEFT breast near the inframammary fold

Ultrasound: Targeted ultrasound is performed, showing A
circumscribed hypoechoic oval parallel mass in the 8 o'clock
location of the LEFT breast 9 centimeters from the nipple measuring
1.9 x 0.4 x 1.5 centimeters. There is associated internal blood
flow.
IMPRESSION: 1. Focal asymmetry in the RIGHT breast, confirmed on ultrasound.
Recommend tissue diagnosis.
2. Probable fibroadenoma in the LEFT breast 8 o'clock location,
warranting follow-up. We discussed management options including
excision, biopsy, and close follow-up. Imaging followup is
recommended at 6, 12, and 24 months to assess stability. The patient
concurs with this plan.

RECOMMENDATION:
1. Recommend ultrasound-guided core biopsy of asymmetry in the 12:30
o'clock location of the RIGHT breast, scheduled for the patient on
06/06/2019.
2. Recommend followup LEFT breast ultrasound in 6 months to assess
stability of probable fibroadenoma.

I have discussed the findings and recommendations with the patient.
Results were also provided in writing at the conclusion of the
visit. If applicable, a reminder letter will be sent to the patient
regarding the next appointment.

BI-RADS CATEGORY  4: Suspicious.

## 2020-09-16 IMAGING — MG DIGITAL DIAGNOSTIC BILATERAL MAMMOGRAM WITH TOMO AND CAD
8 of 15 series · 8 of 40 positions shown · non-contrast
Comparison: Baseline

CLINICAL DATA: Palpable abnormality in the LEFT breast noted 1 week
ago.

EXAM:
DIGITAL DIAGNOSTIC BILATERAL MAMMOGRAM WITH CAD AND TOMO
ULTRASOUND BILATERAL BREAST

[L MLO synth-2D]
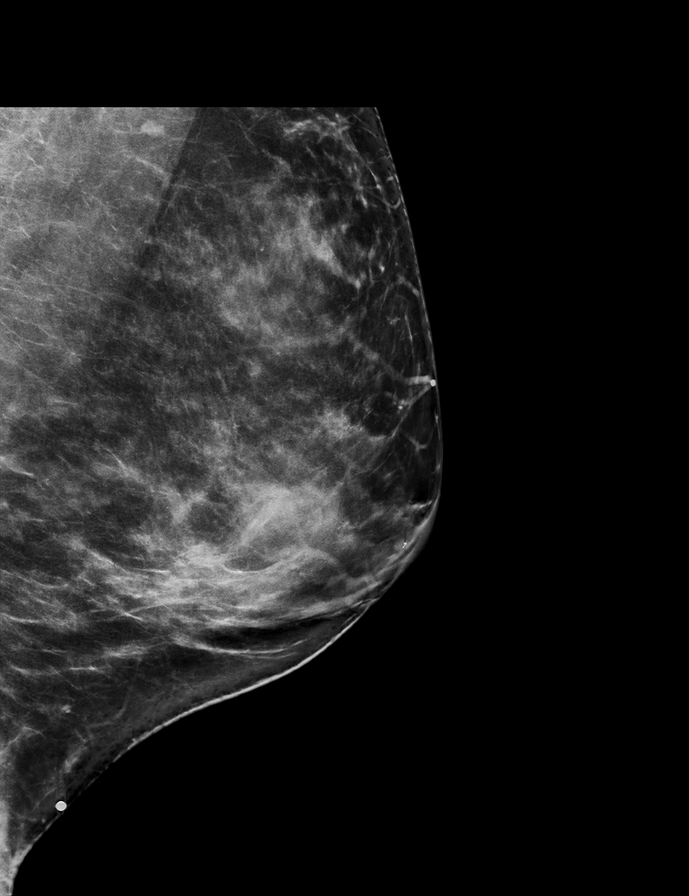

[L CC synth-2D]
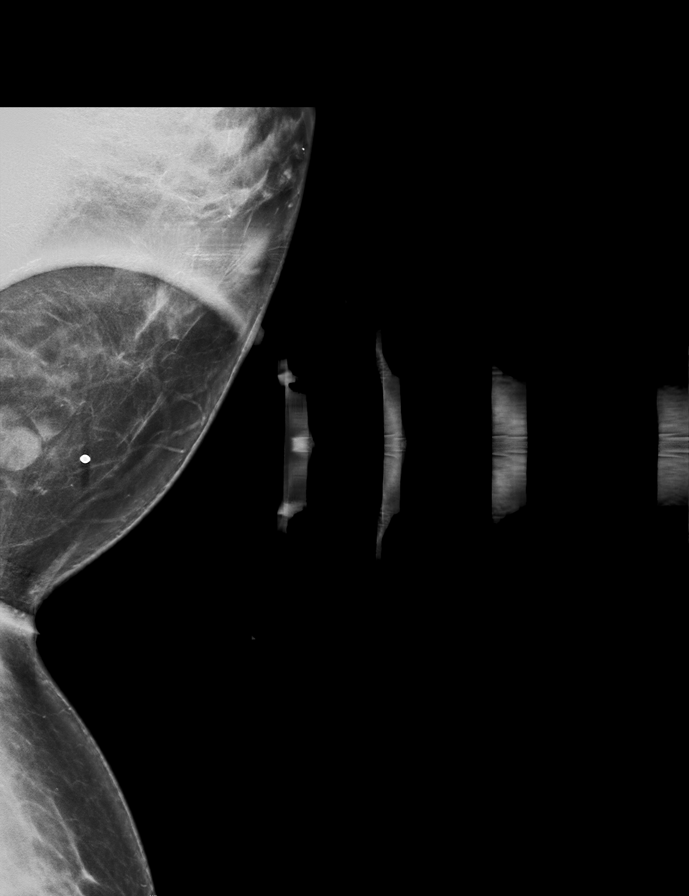

[R MLO synth-2D (1 of 2)]
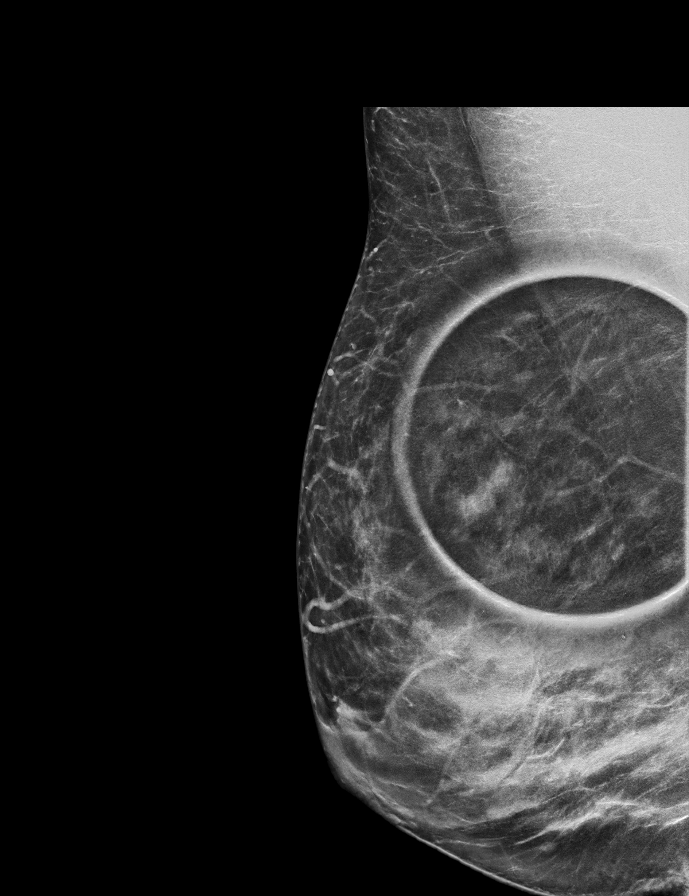

[R CC synth-2D]
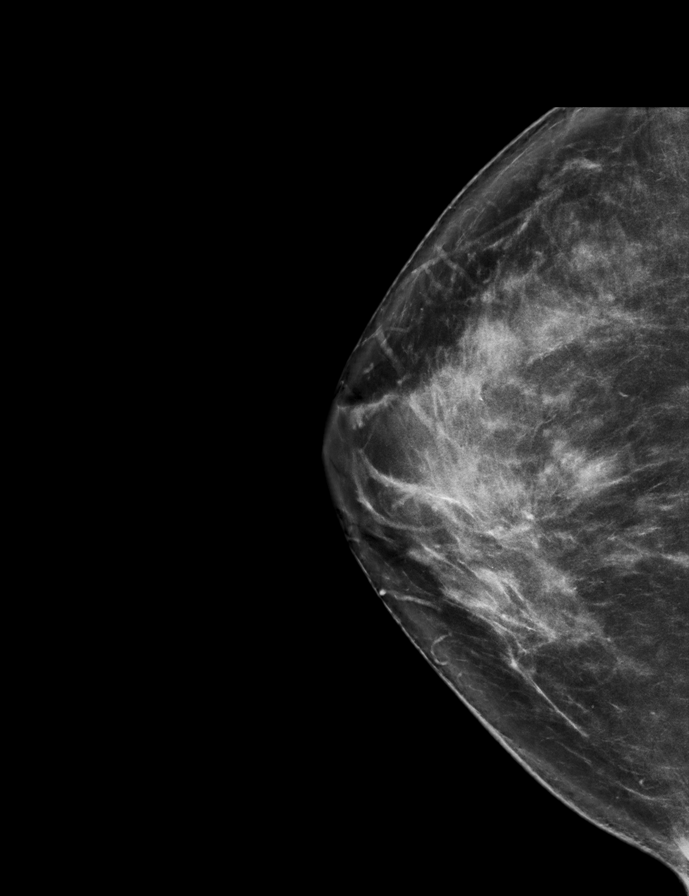

[R ML synth-2D]
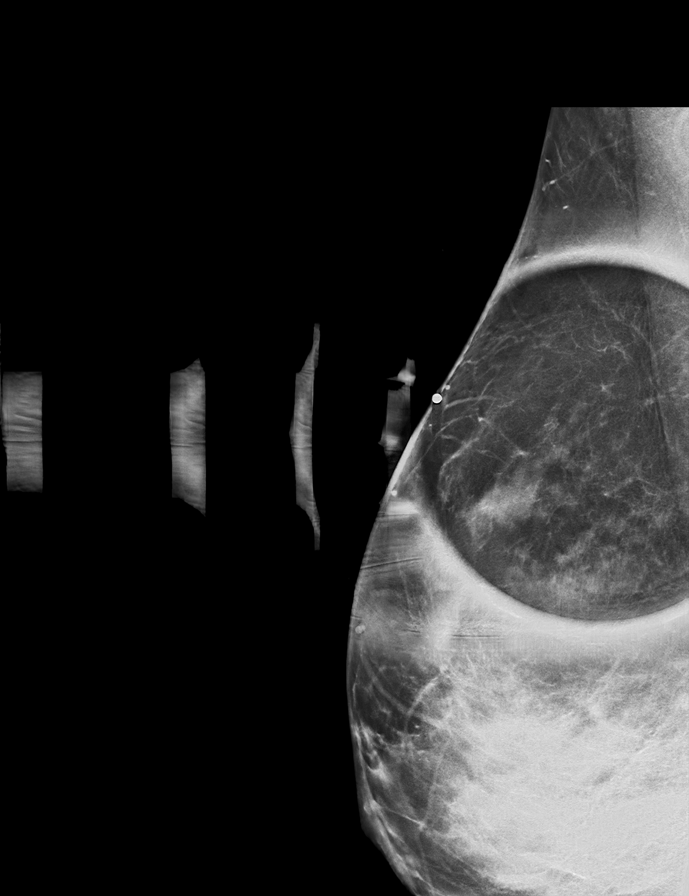

[R MLO synth-2D (2 of 2)]
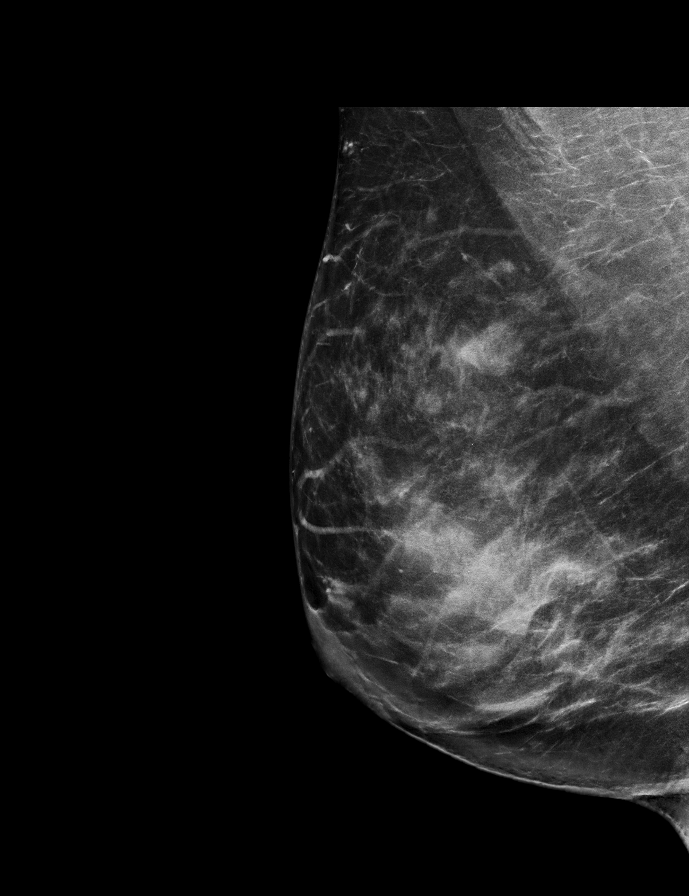

[R XCCM synth-2D]
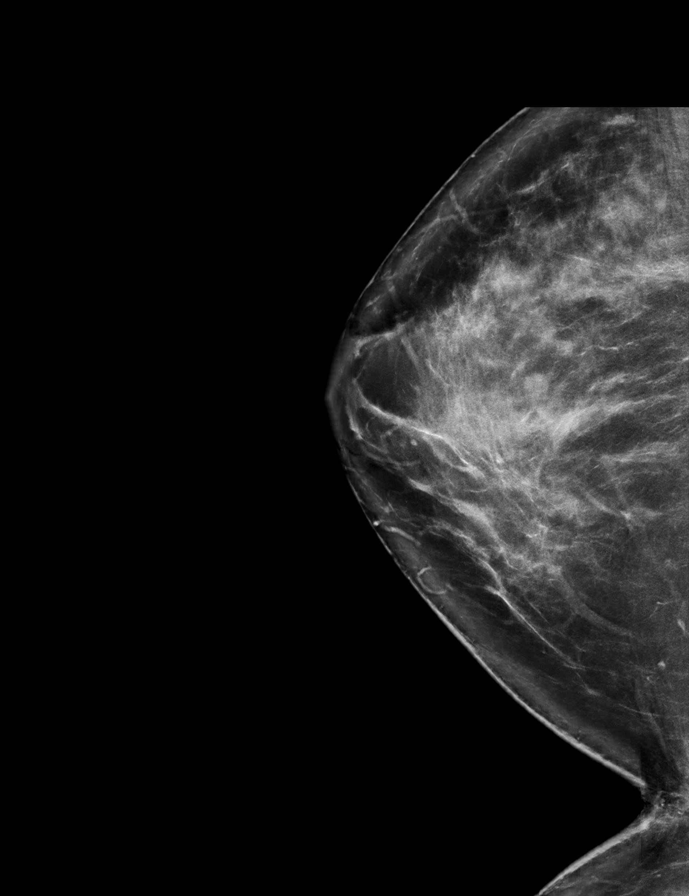

[L CC tomo · tomo slice 51/74.0]
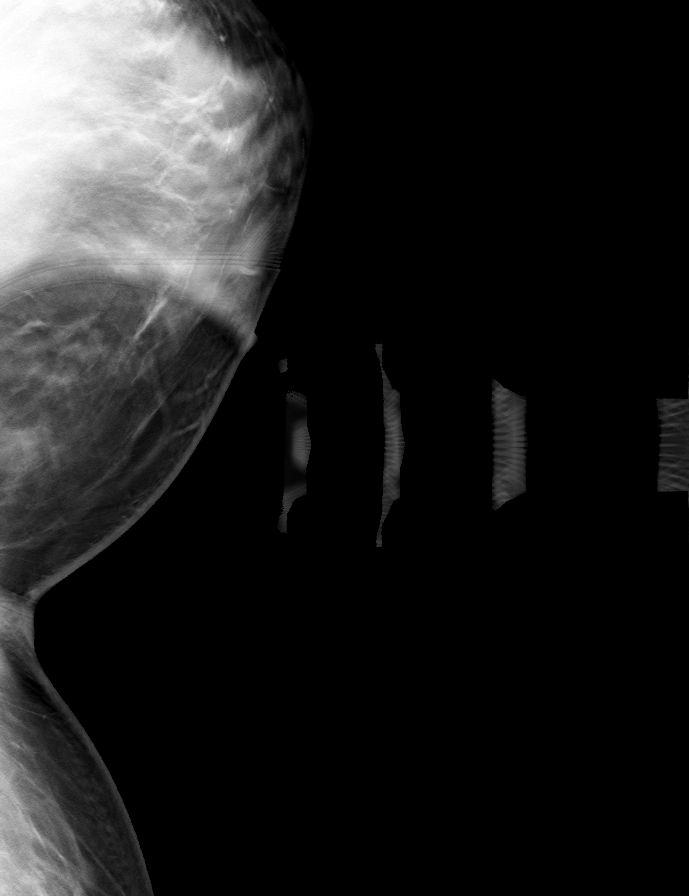

[8 of 40 positions shown; findings below may reference images not displayed]

ACR Breast Density Category c: The breast tissue is heterogeneously
dense, which may obscure small masses.
FINDINGS: Right breast:

Mammogram: Focal asymmetry is identified in the UPPER INNER QUADRANT
of the RIGHT breast, confirmed on additional views, and further
evaluated sonographically. No suspicious distortion or
microcalcifications. Mammographic images were processed with CAD.

Physical Exam: I palpate no abnormality in the UPPER-INNER QUADRANT
of the RIGHT breast.

Ultrasound: Targeted ultrasound is performed, showing focal
hyperechoic tissue containing hypoechoic tubular structures in the
12:30 o'clock location of the RIGHT breast 3 centimeters from the
nipple. There is no associated internal blood flow. These
architecture of tissue in this region is different from the
surrounding breast parenchyma. In BBs placed on this abnormality and
spot tangential view confirms that represents the mammographic
finding.

Evaluation of the RIGHT axilla is negative for adenopathy.

Left breast:

Mammogram: There is a circumscribed isodense mass in the LOWER INNER
QUADRANT of the LEFT breast further evaluated with ultrasound.
Mammographic images were processed with CAD.

Physical Exam: I palpate a discrete mobile oval mass in the LOWER
INNER QUADRANT of the LEFT breast near the inframammary fold

Ultrasound: Targeted ultrasound is performed, showing A
circumscribed hypoechoic oval parallel mass in the 8 o'clock
location of the LEFT breast 9 centimeters from the nipple measuring
1.9 x 0.4 x 1.5 centimeters. There is associated internal blood
flow.
IMPRESSION: 1. Focal asymmetry in the RIGHT breast, confirmed on ultrasound.
Recommend tissue diagnosis.
2. Probable fibroadenoma in the LEFT breast 8 o'clock location,
warranting follow-up. We discussed management options including
excision, biopsy, and close follow-up. Imaging followup is
recommended at 6, 12, and 24 months to assess stability. The patient
concurs with this plan.

RECOMMENDATION:
1. Recommend ultrasound-guided core biopsy of asymmetry in the 12:30
o'clock location of the RIGHT breast, scheduled for the patient on
06/06/2019.
2. Recommend followup LEFT breast ultrasound in 6 months to assess
stability of probable fibroadenoma.

I have discussed the findings and recommendations with the patient.
Results were also provided in writing at the conclusion of the
visit. If applicable, a reminder letter will be sent to the patient
regarding the next appointment.

BI-RADS CATEGORY  4: Suspicious.

## 2020-10-15 ENCOUNTER — Other Ambulatory Visit (HOSPITAL_COMMUNITY): Payer: Self-pay | Admitting: Family Medicine

## 2020-10-15 MED FILL — FAMOTIDINE 40 MG TABS: 40 | 90 days supply | Qty: 90 | Fill #0

## 2020-10-15 MED FILL — LOSARTAN POTASSIUM 25 MG TA: 25 | 30 days supply | Qty: 30 | Fill #0

## 2020-11-20 ENCOUNTER — Other Ambulatory Visit (HOSPITAL_COMMUNITY): Payer: Self-pay | Admitting: Family Medicine

## 2020-11-20 MED FILL — LOSARTAN POTASSIUM 25 MG TA: 25 | 30 days supply | Qty: 30 | Fill #1

## 2020-11-20 MED FILL — metFORMIN HCL ER 500 MG TB2: 500 | 90 days supply | Qty: 180 | Fill #0

## 2020-11-20 MED FILL — ROSUVASTATIN CALCIUM 5 MG T: 5 | 90 days supply | Qty: 90 | Fill #1

## 2020-11-22 MED FILL — traZODone HCL 100 MG TABS: 100 | 90 days supply | Qty: 90 | Fill #1

## 2020-12-06 ENCOUNTER — Other Ambulatory Visit (HOSPITAL_COMMUNITY): Payer: Self-pay | Admitting: Family Medicine

## 2020-12-06 MED FILL — AMLODIPINE BESYLATE 10 MG T: 10 | 90 days supply | Qty: 90 | Fill #0

## 2020-12-24 MED FILL — LOSARTAN POTASSIUM 25 MG TA: 25 | 30 days supply | Qty: 30 | Fill #2

## 2020-12-27 ENCOUNTER — Other Ambulatory Visit (HOSPITAL_BASED_OUTPATIENT_CLINIC_OR_DEPARTMENT_OTHER): Payer: Self-pay

## 2021-01-03 MED FILL — FAMOTIDINE 40 MG TABS: 40 | 90 days supply | Qty: 90 | Fill #0

## 2021-01-24 ENCOUNTER — Other Ambulatory Visit (HOSPITAL_COMMUNITY): Payer: Self-pay

## 2021-01-24 MED FILL — Losartan Potassium Tab 25 MG: ORAL | 90 days supply | Qty: 90 | Fill #0 | Status: AC

## 2021-01-25 ENCOUNTER — Other Ambulatory Visit (HOSPITAL_COMMUNITY): Payer: Self-pay

## 2021-01-28 ENCOUNTER — Other Ambulatory Visit (HOSPITAL_COMMUNITY): Payer: Self-pay

## 2021-02-02 ENCOUNTER — Other Ambulatory Visit (HOSPITAL_COMMUNITY): Payer: Self-pay

## 2021-02-22 ENCOUNTER — Other Ambulatory Visit (HOSPITAL_COMMUNITY): Payer: Self-pay

## 2021-02-22 MED ORDER — ROSUVASTATIN CALCIUM 5 MG PO TABS
ORAL_TABLET | ORAL | 1 refills | Status: AC
  Filled 2021-02-22: qty 90, 90d supply, fill #0
  Filled 2021-05-24: qty 90, 90d supply, fill #1

## 2021-02-22 MED ORDER — METFORMIN HCL ER 500 MG PO TB24
ORAL_TABLET | ORAL | 1 refills | Status: DC
  Filled 2021-02-22: qty 180, 90d supply, fill #0
  Filled 2021-05-24: qty 180, 90d supply, fill #1

## 2021-02-25 ENCOUNTER — Other Ambulatory Visit (HOSPITAL_COMMUNITY): Payer: Self-pay

## 2021-02-25 ENCOUNTER — Other Ambulatory Visit (HOSPITAL_BASED_OUTPATIENT_CLINIC_OR_DEPARTMENT_OTHER): Payer: Self-pay

## 2021-03-07 ENCOUNTER — Other Ambulatory Visit (HOSPITAL_COMMUNITY): Payer: Self-pay

## 2021-03-07 MED FILL — Amlodipine Besylate Tab 10 MG (Base Equivalent): ORAL | 90 days supply | Qty: 90 | Fill #0 | Status: AC

## 2021-04-08 ENCOUNTER — Other Ambulatory Visit (HOSPITAL_COMMUNITY): Payer: Self-pay

## 2021-04-08 MED FILL — Famotidine Tab 40 MG: ORAL | 90 days supply | Qty: 90 | Fill #0 | Status: AC

## 2021-04-09 ENCOUNTER — Other Ambulatory Visit (HOSPITAL_COMMUNITY): Payer: Self-pay

## 2021-04-10 ENCOUNTER — Other Ambulatory Visit (HOSPITAL_COMMUNITY): Payer: Self-pay

## 2021-04-11 ENCOUNTER — Other Ambulatory Visit (HOSPITAL_COMMUNITY): Payer: Self-pay

## 2021-04-11 MED ORDER — TRAZODONE HCL 100 MG PO TABS
100.0000 mg | ORAL_TABLET | Freq: Every evening | ORAL | 1 refills | Status: DC
  Filled 2021-04-11: qty 90, 90d supply, fill #0
  Filled 2021-09-11: qty 90, 90d supply, fill #1

## 2021-04-15 ENCOUNTER — Other Ambulatory Visit (HOSPITAL_COMMUNITY): Payer: Self-pay

## 2021-04-25 ENCOUNTER — Other Ambulatory Visit (HOSPITAL_COMMUNITY): Payer: Self-pay

## 2021-04-26 ENCOUNTER — Other Ambulatory Visit (HOSPITAL_COMMUNITY): Payer: Self-pay

## 2021-04-26 MED ORDER — LOSARTAN POTASSIUM 50 MG PO TABS
ORAL_TABLET | ORAL | 2 refills | Status: AC
  Filled 2021-04-26: qty 45, 90d supply, fill #0

## 2021-05-25 ENCOUNTER — Other Ambulatory Visit (HOSPITAL_COMMUNITY): Payer: Self-pay

## 2021-06-09 ENCOUNTER — Other Ambulatory Visit (HOSPITAL_COMMUNITY): Payer: Self-pay

## 2021-06-11 ENCOUNTER — Other Ambulatory Visit (HOSPITAL_COMMUNITY): Payer: Self-pay

## 2021-06-11 MED ORDER — AMLODIPINE BESYLATE 10 MG PO TABS
ORAL_TABLET | ORAL | 0 refills | Status: AC
  Filled 2021-06-11: qty 90, 90d supply, fill #0

## 2021-06-22 ENCOUNTER — Ambulatory Visit
Admission: EM | Admit: 2021-06-22 | Discharge: 2021-06-22 | Disposition: A | Discharge status: Home / Self Care | Payer: 59 | Attending: Emergency Medicine | Admitting: Emergency Medicine

## 2021-06-22 ENCOUNTER — Encounter: Payer: Self-pay | Admitting: Emergency Medicine

## 2021-06-22 ENCOUNTER — Other Ambulatory Visit: Payer: Self-pay

## 2021-06-22 ENCOUNTER — Ambulatory Visit (INDEPENDENT_AMBULATORY_CARE_PROVIDER_SITE_OTHER): Payer: 59

## 2021-06-22 DIAGNOSIS — S82492A Other fracture of shaft of left fibula, initial encounter for closed fracture: Secondary | ICD-10-CM | POA: Diagnosis not present

## 2021-06-22 DIAGNOSIS — S82402A Unspecified fracture of shaft of left fibula, initial encounter for closed fracture: Secondary | ICD-10-CM | POA: Diagnosis not present

## 2021-06-22 DIAGNOSIS — W19XXXA Unspecified fall, initial encounter: Secondary | ICD-10-CM

## 2021-06-22 DIAGNOSIS — S82832A Other fracture of upper and lower end of left fibula, initial encounter for closed fracture: Secondary | ICD-10-CM | POA: Diagnosis not present

## 2021-06-22 DIAGNOSIS — M79662 Pain in left lower leg: Secondary | ICD-10-CM

## 2021-06-22 MED ORDER — IBUPROFEN 800 MG PO TABS: 800.0000 mg | ORAL_TABLET | Freq: Three times a day (TID) | ORAL | 0 refills | Status: AC

## 2021-06-22 MED ORDER — HYDROCODONE-ACETAMINOPHEN 5-325 MG PO TABS: 1.0000 | ORAL_TABLET | Freq: Four times a day (QID) | ORAL | 0 refills | Status: AC | PRN

## 2021-06-22 NOTE — Discharge Instructions (Addendum)
Please follow-up with orthopedics Nonweightbearing with crutches Wear knee immobilizer 24/7 Tylenol and ibuprofen for mild to moderate pain Hydrocodone for severe pain-use sparingly, will cause drowsiness

## 2021-06-22 NOTE — ED Triage Notes (Signed)
Pt here for trip and fall last night; pt c/o lower left leg pain; pt sts unable to walk due to pain; pt noted temp 100.9 denies any sx

## 2021-06-22 NOTE — ED Provider Notes (Signed)
[facility]    CSN: [id number] Arrival date & time: 06/22/21  1029      History   Chief Complaint Chief Complaint  Patient presents with   Fall    HPI [name] [name] is a 40 y.o. female presenting today for evaluation after a fall.  Reports that she tripped and fell and since has had lower left leg pain.  Slipped on a wet floor and fell.  Since has had pain to her left lower leg.  Denies knee or ankle pain, pain is mainly on the outer aspect of her calf.  Pain worsens with weightbearing.  Denies history of problems with knee/ankle.  HPI  Past Medical History:  Diagnosis Date   Abnormal uterine bleeding    Anemia    history of   Diabetes mellitus without complication (HCC)    Elevated hemoglobin A1c    Elevated liver function tests 05/28/2016   Resolved July 2018.   Fatty liver    GERD (gastroesophageal reflux disease)    Hypercalcemia due to hyperthyroidism    Hyperparathyroidism, primary [facility])    endocrinologist-  dr Loanne Drilling   Hypertension    Thyroid nodule    per endocrinoloigst note (dr ellision)  secondary to primary hyperparathyroidism   Uterine fibroid     Patient Active Problem List   Diagnosis Date Noted   Status post total abdominal hysterectomy 06/01/2017   Hyperparathyroidism, primary ([place]) 10/28/2016   Thyroid nodule 10/28/2016    Past Surgical History:  Procedure Laterality Date   ABDOMINAL HYSTERECTOMY     BREAST BIOPSY Right 06/06/2019   HYSTERECTOMY ABDOMINAL WITH SALPINGECTOMY Bilateral 06/01/2017   Procedure: HYSTERECTOMY ABDOMINAL WITH Bilateral SALPINGECTOMY;  Surgeon: Nunzio Cobbs, MD;  Location: [facility];  Service: Gynecology;  Laterality: Bilateral;   NO PAST SURGERIES      OB History     Gravida  1   Para  1   Term  1   Preterm      AB      Living  1      SAB      IAB      Ectopic      Multiple      Live Births               Home Medications    Prior to Admission medications    Medication Sig Start Date End Date Taking? Authorizing Provider  HYDROcodone-acetaminophen (NORCO/VICODIN) 5-325 MG tablet Take 1-2 tablets by mouth every 6 (six) hours as needed. 06/22/21  Yes Holdyn Poyser C, PA-C  ibuprofen (ADVIL) 800 MG tablet Take 1 tablet (800 mg total) by mouth 3 (three) times daily. 06/22/21  Yes Jamiah Recore C, PA-C  amLODipine (NORVASC) 10 MG tablet Take 10 mg by mouth daily.  05/02/16   [provider]  amLODipine (NORVASC) 10 MG tablet TAKE 1 TABLET BY MOUTH DAILY 12/06/20 12/06/21  Marda Stalker, PA-C  amLODipine (NORVASC) 10 MG tablet TAKE 1 TABLET BY MOUTH ONCE DAILY 07/11/20 07/11/21  Marda Stalker, PA-C  amLODipine (NORVASC) 10 MG tablet Take 1 tablet by mouth once a day 06/11/21     famotidine (PEPCID) 40 MG tablet TAKE 1 TABLET BY MOUTH ONCE DAILY AT BEDTIME 10/15/20 10/15/21  Marda Stalker, PA-C  famotidine (PEPCID) 40 MG tablet TAKE 1 TABLET BY MOUTH AT BEDTIME 07/09/20 07/09/21  Marda Stalker, PA-C  famotidine (PEPCID) 40 MG tablet TAKE 1 TABLET BY MOUTH ONCE DAILY AT BEDTIME 07/09/20 07/09/21  Marda Stalker, PA-C  losartan (COZAAR) 25 MG tablet TAKE 1 TABLET ('25MG'$ ) BY MOUTH ONCE A DAY 07/11/20 07/11/21  Marda Stalker, PA-C  losartan (COZAAR) 50 MG tablet Take 25 mg by mouth daily.  05/02/16   [provider]  losartan (COZAAR) 50 MG tablet TAKE 1/2 TABLET BY MOUTH DAILY ONCE A DAY. 07/11/20 07/11/21  Marda Stalker, PA-C  losartan (COZAAR) 50 MG tablet Take 1/2 tablet by mouth daily 04/26/21     metFORMIN (GLUCOPHAGE-XR) 500 MG 24 hr tablet Take 500 mg by mouth 2 (two) times daily.  03/10/17   [provider]  metFORMIN (GLUCOPHAGE-XR) 500 MG 24 hr tablet TAKE 1 TABLET BY MOUTH 2 TIMES DAILY WITH FOOD (NEED APPT FOR REFILLS) 11/20/20 11/20/21  Marda Stalker, PA-C  metFORMIN (GLUCOPHAGE-XR) 500 MG 24 hr tablet TAKE 1 TABLET BY MOUTH TWO TIMES DAILY WITH FOOD 08/10/20 08/10/21  Marda Stalker, PA-C  metFORMIN  (GLUCOPHAGE-XR) 500 MG 24 hr tablet TAKE 1 TABLET BY MOUTH TWICE DAILY WITH FOOD 07/11/20 07/11/21  Marda Stalker, PA-C  metFORMIN (GLUCOPHAGE-XR) 500 MG 24 hr tablet Take 1 tablet by mouth twice a day with food(OVERDUE FOR VISIT) 02/22/21     omeprazole (PRILOSEC) 20 MG capsule Take 20 mg by mouth daily as needed (for acid reflux/indigestion.).     [provider]  rosuvastatin (CRESTOR) 5 MG tablet TAKE 1 TABLET BY MOUTH ONCE A DAY 08/14/20 08/14/21  Marda Stalker, PA-C  rosuvastatin (CRESTOR) 5 MG tablet Take 1 tablet by mouth Once a day 02/22/21     traZODone (DESYREL) 100 MG tablet TAKE 1 TABLET BY MOUTH ONCE DAILY AT BEDTIME AS NEEDED 07/11/20 07/11/21  Marda Stalker, PA-C  traZODone (DESYREL) 100 MG tablet Take 1 tablet (100 mg total) by mouth at bedtime. 04/11/21       Family History Family History  Problem Relation Age of Onset   Stroke Mother    Hypertension Mother    Breast cancer Sister 23       A & W.  Genetic testing negative.   Diabetes Paternal Grandmother    Hypercalcemia Neg Hx     Social History Social History   Tobacco Use   Smoking status: Never   Smokeless tobacco: Never  Vaping Use   Vaping Use: Never used  Substance Use Topics   Alcohol use: Yes    Alcohol/week: 2.0 standard drinks    Types: 2 Standard drinks or equivalent per week    Comment: occasional   Drug use: No     Allergies   Patient has no known allergies.   Review of Systems Review of Systems  Constitutional:  Negative for activity change, chills, diaphoresis and fatigue.  HENT:  Negative for ear pain, tinnitus and trouble swallowing.   Eyes:  Negative for photophobia and visual disturbance.  Respiratory:  Negative for cough, chest tightness and shortness of breath.   Cardiovascular:  Negative for chest pain and leg swelling.  Gastrointestinal:  Negative for abdominal pain, blood in stool, nausea and vomiting.  Musculoskeletal:  Positive for gait problem and myalgias.  Negative for arthralgias, back pain, neck pain and neck stiffness.  Skin:  Negative for color change and wound.  Neurological:  Negative for dizziness, weakness, light-headedness, numbness and headaches.    Physical Exam Triage Vital Signs ED Triage Vitals  Enc Vitals Group     BP 06/22/21 1042 (!) 152/84     Pulse Rate 06/22/21 1042 (!) 120     Resp 06/22/21 1042 18  Temp 06/22/21 1042 (!) 100.9 F (38.3 C)     Temp Source 06/22/21 1042 Oral     SpO2 06/22/21 1042 95 %     Weight --      Height --      Head Circumference --      Peak Flow --      Pain Score 06/22/21 1043 7     Pain Loc --      Pain Edu? --      Excl. in [place]? --    No data found.  Updated Vital Signs BP (!) 152/84 (BP Location: Right Arm)   Pulse (!) 120   Temp 99.6 F (37.6 C) (Oral)   Resp 18   LMP 03/11/2017 (Exact Date)   SpO2 95%   Visual Acuity Right Eye Distance:   Left Eye Distance:   Bilateral Distance:    Right Eye Near:   Left Eye Near:    Bilateral Near:     Physical Exam Vitals and nursing note reviewed.  Constitutional:      Appearance: She is well-developed.     Comments: No acute distress  HENT:     Head: Normocephalic and atraumatic.     Nose: Nose normal.  Eyes:     Conjunctiva/sclera: Conjunctivae normal.  Cardiovascular:     Rate and Rhythm: Tachycardia present.  Pulmonary:     Effort: Pulmonary effort is normal. No respiratory distress.  Abdominal:     General: There is no distension.  Musculoskeletal:        General: Normal range of motion.     Cervical back: Neck supple.     Comments: Left lower leg: No obvious swelling or deformity, mild tenderness to palpation over anterior shin extending laterally, nontender to palpation throughout belly of calf  Left knee without tenderness to palpation of patella, medial lateral joint line, full flexion and extension, no laxity appreciated  Left ankle: No tenderness to palpation of medial or lateral malleolus, nontender  anteriorly, full active range of motion of ankle, dorsalis pedis 2+  Skin:    General: Skin is warm and dry.  Neurological:     Mental Status: She is alert and oriented to person, place, and time.     UC Treatments / Results  Labs (all labs ordered are listed, but only abnormal results are displayed) Labs Reviewed - No data to display  EKG   Radiology DG Tibia/Fibula Left  Result Date: 06/22/2021 CLINICAL DATA:  Pain after fall. EXAM: LEFT TIBIA AND FIBULA - 2 VIEW COMPARISON:  None. FINDINGS: There is a nondisplaced fracture through the proximal fibular diaphysis. IMPRESSION: Nondisplaced fracture through the proximal fibular diaphysis. Electronically Signed   By: Dorise Bullion III M.D.   On: 06/22/2021 11:30    Procedures Procedures (including critical care time)  Medications Ordered in UC Medications - No data to display  Initial Impression / Assessment and Plan / UC Course  I have reviewed the triage vital signs and the nursing notes.  Pertinent labs & imaging results that were available during my care of the patient were reviewed by me and considered in my medical decision making (see chart for details).     Left fibular diaphysis fracture, nondisplaced-recommended knee immobilizer given fracture more proximal, crutches and nonweightbearing, follow-up with orthopedics..  Fever-rechecked and was 100.0, unclear etiology, denies urinary symptoms, denies uri symptoms, will screen for COVID, continue to monitor  Discussed strict return precautions. Patient verbalized understanding and is agreeable with plan.  Final Clinical Impressions(s) / UC Diagnoses   Final diagnoses:  Closed fracture of shaft of left fibula, unspecified fracture morphology, initial encounter     Discharge Instructions      Please follow-up with orthopedics Nonweightbearing with crutches Wear knee immobilizer 24/7 Tylenol and ibuprofen for mild to moderate pain Hydrocodone for severe  pain-use sparingly, will cause drowsiness      ED Prescriptions     Medication Sig Dispense Auth. Provider   ibuprofen (ADVIL) 800 MG tablet Take 1 tablet (800 mg total) by mouth 3 (three) times daily. 21 tablet Najeh Credit C, PA-C   HYDROcodone-acetaminophen (NORCO/VICODIN) 5-325 MG tablet Take 1-2 tablets by mouth every 6 (six) hours as needed. 10 tablet Jadie Allington, [place] C, PA-C      I have reviewed the PDMP during this encounter.   Janith Lima, PA-C 06/22/21 1215

## 2021-06-25 DIAGNOSIS — S82832A Other fracture of upper and lower end of left fibula, initial encounter for closed fracture: Secondary | ICD-10-CM | POA: Diagnosis not present

## 2021-06-28 ENCOUNTER — Other Ambulatory Visit (HOSPITAL_COMMUNITY): Payer: Self-pay

## 2021-06-28 MED ORDER — TRAMADOL HCL 50 MG PO TABS
ORAL_TABLET | ORAL | 0 refills | Status: AC
  Filled 2021-06-28: qty 15, 5d supply, fill #0

## 2021-07-12 ENCOUNTER — Other Ambulatory Visit (HOSPITAL_COMMUNITY): Payer: Self-pay

## 2021-07-12 DIAGNOSIS — S82832A Other fracture of upper and lower end of left fibula, initial encounter for closed fracture: Secondary | ICD-10-CM | POA: Diagnosis not present

## 2021-07-12 DIAGNOSIS — M25562 Pain in left knee: Secondary | ICD-10-CM | POA: Diagnosis not present

## 2021-07-12 MED ORDER — METFORMIN HCL ER 500 MG PO TB24
ORAL_TABLET | ORAL | 1 refills | Status: DC
  Filled 2021-07-12 – 2021-08-21 (×2): qty 180, 90d supply, fill #0
  Filled 2021-11-30: qty 180, 90d supply, fill #1

## 2021-07-13 ENCOUNTER — Other Ambulatory Visit (HOSPITAL_COMMUNITY): Payer: Self-pay

## 2021-07-23 DIAGNOSIS — E1169 Type 2 diabetes mellitus with other specified complication: Secondary | ICD-10-CM | POA: Diagnosis not present

## 2021-07-23 DIAGNOSIS — Z0000 Encounter for general adult medical examination without abnormal findings: Secondary | ICD-10-CM | POA: Diagnosis not present

## 2021-07-23 DIAGNOSIS — Z13220 Encounter for screening for lipoid disorders: Secondary | ICD-10-CM | POA: Diagnosis not present

## 2021-07-23 DIAGNOSIS — E7800 Pure hypercholesterolemia, unspecified: Secondary | ICD-10-CM | POA: Diagnosis not present

## 2021-07-23 DIAGNOSIS — Z23 Encounter for immunization: Secondary | ICD-10-CM | POA: Diagnosis not present

## 2021-07-24 ENCOUNTER — Other Ambulatory Visit (HOSPITAL_COMMUNITY): Payer: Self-pay

## 2021-07-24 MED ORDER — FAMOTIDINE 40 MG PO TABS
40.0000 mg | ORAL_TABLET | Freq: Every evening | ORAL | 1 refills | Status: DC
  Filled 2021-07-24: qty 90, 90d supply, fill #0
  Filled 2021-10-26: qty 90, 90d supply, fill #1

## 2021-07-24 MED ORDER — LOSARTAN POTASSIUM 50 MG PO TABS
ORAL_TABLET | ORAL | 2 refills | Status: DC
  Filled 2021-07-24: qty 45, 90d supply, fill #0
  Filled 2021-10-26: qty 45, 90d supply, fill #1
  Filled 2022-02-02: qty 45, 90d supply, fill #2

## 2021-07-27 DIAGNOSIS — M25562 Pain in left knee: Secondary | ICD-10-CM | POA: Diagnosis not present

## 2021-08-12 DIAGNOSIS — S83512A Sprain of anterior cruciate ligament of left knee, initial encounter: Secondary | ICD-10-CM | POA: Diagnosis not present

## 2021-08-12 DIAGNOSIS — S83412A Sprain of medial collateral ligament of left knee, initial encounter: Secondary | ICD-10-CM | POA: Diagnosis not present

## 2021-08-21 ENCOUNTER — Other Ambulatory Visit (HOSPITAL_COMMUNITY): Payer: Self-pay

## 2021-09-09 ENCOUNTER — Other Ambulatory Visit (HOSPITAL_COMMUNITY): Payer: Self-pay

## 2021-09-09 MED ORDER — ROSUVASTATIN CALCIUM 5 MG PO TABS
ORAL_TABLET | ORAL | 3 refills | Status: DC
  Filled 2021-09-09: qty 90, 90d supply, fill #0
  Filled 2021-12-15: qty 90, 90d supply, fill #1
  Filled 2022-03-25: qty 90, 90d supply, fill #2
  Filled 2022-07-07: qty 90, 90d supply, fill #3

## 2021-09-09 MED ORDER — AMLODIPINE BESYLATE 10 MG PO TABS
ORAL_TABLET | ORAL | 3 refills | Status: DC
  Filled 2021-09-09: qty 90, 90d supply, fill #0
  Filled 2021-12-15: qty 90, 90d supply, fill #1
  Filled 2022-03-25: qty 90, 90d supply, fill #2
  Filled 2022-07-07: qty 90, 90d supply, fill #3

## 2021-09-11 ENCOUNTER — Other Ambulatory Visit (HOSPITAL_COMMUNITY): Payer: Self-pay

## 2021-10-26 ENCOUNTER — Other Ambulatory Visit (HOSPITAL_COMMUNITY): Payer: Self-pay

## 2021-10-28 ENCOUNTER — Other Ambulatory Visit (HOSPITAL_COMMUNITY): Payer: Self-pay

## 2021-11-04 ENCOUNTER — Other Ambulatory Visit: Payer: Self-pay

## 2021-11-25 ENCOUNTER — Other Ambulatory Visit (HOSPITAL_COMMUNITY): Payer: Self-pay

## 2021-11-25 MED ORDER — TRAZODONE HCL 100 MG PO TABS
100.0000 mg | ORAL_TABLET | Freq: Every day | ORAL | 2 refills | Status: AC
  Filled 2021-11-25: qty 90, 90d supply, fill #0
  Filled 2022-02-14: qty 90, 90d supply, fill #1
  Filled 2022-05-18: qty 90, 90d supply, fill #2

## 2021-11-30 ENCOUNTER — Other Ambulatory Visit (HOSPITAL_COMMUNITY): Payer: Self-pay

## 2021-12-15 ENCOUNTER — Other Ambulatory Visit (HOSPITAL_COMMUNITY): Payer: Self-pay

## 2021-12-16 ENCOUNTER — Other Ambulatory Visit (HOSPITAL_COMMUNITY): Payer: Self-pay

## 2022-01-23 ENCOUNTER — Other Ambulatory Visit (HOSPITAL_COMMUNITY): Payer: Self-pay

## 2022-01-24 ENCOUNTER — Other Ambulatory Visit (HOSPITAL_COMMUNITY): Payer: Self-pay

## 2022-01-24 MED ORDER — METFORMIN HCL ER 500 MG PO TB24
ORAL_TABLET | ORAL | 2 refills | Status: AC
  Filled 2022-01-24: qty 60, 30d supply, fill #0
  Filled 2022-01-24: qty 180, 90d supply, fill #0
  Filled 2022-04-16: qty 60, 30d supply, fill #1
  Filled 2022-05-18: qty 60, 30d supply, fill #2
  Filled 2022-06-21: qty 60, 30d supply, fill #3
  Filled 2022-07-24: qty 60, 30d supply, fill #4
  Filled 2022-08-29: qty 60, 30d supply, fill #5
  Filled 2022-09-26: qty 60, 30d supply, fill #6
  Filled 2022-10-31: qty 60, 30d supply, fill #7
  Filled 2022-11-30: qty 60, 30d supply, fill #8

## 2022-02-02 ENCOUNTER — Other Ambulatory Visit (HOSPITAL_COMMUNITY): Payer: Self-pay

## 2022-02-03 ENCOUNTER — Other Ambulatory Visit (HOSPITAL_COMMUNITY): Payer: Self-pay

## 2022-02-03 MED ORDER — FAMOTIDINE 40 MG PO TABS
ORAL_TABLET | ORAL | 1 refills | Status: AC
  Filled 2022-02-03: qty 90, 90d supply, fill #0
  Filled 2022-05-04: qty 90, 90d supply, fill #1

## 2022-02-14 ENCOUNTER — Other Ambulatory Visit (HOSPITAL_COMMUNITY): Payer: Self-pay

## 2022-03-25 ENCOUNTER — Other Ambulatory Visit (HOSPITAL_COMMUNITY): Payer: Self-pay

## 2022-04-16 ENCOUNTER — Other Ambulatory Visit (HOSPITAL_COMMUNITY): Payer: Self-pay

## 2022-05-04 ENCOUNTER — Other Ambulatory Visit (HOSPITAL_COMMUNITY): Payer: Self-pay

## 2022-05-05 ENCOUNTER — Other Ambulatory Visit (HOSPITAL_COMMUNITY): Payer: Self-pay

## 2022-05-05 MED ORDER — LOSARTAN POTASSIUM 50 MG PO TABS
ORAL_TABLET | ORAL | 0 refills | Status: DC
  Filled 2022-05-05: qty 45, 90d supply, fill #0

## 2022-05-19 ENCOUNTER — Other Ambulatory Visit (HOSPITAL_COMMUNITY): Payer: Self-pay

## 2022-06-21 ENCOUNTER — Other Ambulatory Visit (HOSPITAL_COMMUNITY): Payer: Self-pay

## 2022-06-25 ENCOUNTER — Other Ambulatory Visit (HOSPITAL_COMMUNITY): Payer: Self-pay

## 2022-07-07 ENCOUNTER — Other Ambulatory Visit (HOSPITAL_COMMUNITY): Payer: Self-pay

## 2022-07-24 ENCOUNTER — Other Ambulatory Visit (HOSPITAL_COMMUNITY): Payer: Self-pay

## 2022-07-28 ENCOUNTER — Other Ambulatory Visit: Payer: Self-pay | Admitting: Obstetrics and Gynecology

## 2022-07-28 DIAGNOSIS — N6320 Unspecified lump in the left breast, unspecified quadrant: Secondary | ICD-10-CM

## 2022-07-31 ENCOUNTER — Other Ambulatory Visit (HOSPITAL_COMMUNITY): Payer: Self-pay

## 2022-07-31 DIAGNOSIS — E7800 Pure hypercholesterolemia, unspecified: Secondary | ICD-10-CM | POA: Diagnosis not present

## 2022-07-31 DIAGNOSIS — E1169 Type 2 diabetes mellitus with other specified complication: Secondary | ICD-10-CM | POA: Diagnosis not present

## 2022-07-31 DIAGNOSIS — Z0000 Encounter for general adult medical examination without abnormal findings: Secondary | ICD-10-CM | POA: Diagnosis not present

## 2022-07-31 DIAGNOSIS — I10 Essential (primary) hypertension: Secondary | ICD-10-CM | POA: Diagnosis not present

## 2022-07-31 DIAGNOSIS — H6123 Impacted cerumen, bilateral: Secondary | ICD-10-CM | POA: Diagnosis not present

## 2022-07-31 DIAGNOSIS — Z6833 Body mass index (BMI) 33.0-33.9, adult: Secondary | ICD-10-CM | POA: Diagnosis not present

## 2022-07-31 DIAGNOSIS — Z23 Encounter for immunization: Secondary | ICD-10-CM | POA: Diagnosis not present

## 2022-07-31 MED ORDER — TRAZODONE HCL 100 MG PO TABS
100.0000 mg | ORAL_TABLET | Freq: Every day | ORAL | 0 refills | Status: DC | PRN
  Filled 2022-07-31: qty 90, 90d supply, fill #0

## 2022-07-31 MED ORDER — TRAZODONE HCL 100 MG PO TABS
100.0000 mg | ORAL_TABLET | Freq: Every day | ORAL | 3 refills | Status: DC | PRN
  Filled 2022-07-31 – 2022-10-20 (×2): qty 90, 90d supply, fill #0
  Filled 2023-01-12: qty 90, 90d supply, fill #1
  Filled 2023-04-04: qty 90, 90d supply, fill #2
  Filled 2023-07-06: qty 90, 90d supply, fill #3

## 2022-08-01 ENCOUNTER — Other Ambulatory Visit (HOSPITAL_COMMUNITY): Payer: Self-pay

## 2022-08-07 ENCOUNTER — Other Ambulatory Visit (HOSPITAL_COMMUNITY): Payer: Self-pay

## 2022-08-08 ENCOUNTER — Other Ambulatory Visit (HOSPITAL_COMMUNITY): Payer: Self-pay

## 2022-08-08 MED ORDER — LOSARTAN POTASSIUM 50 MG PO TABS
25.0000 mg | ORAL_TABLET | Freq: Every day | ORAL | 0 refills | Status: DC
  Filled 2022-08-08: qty 45, 90d supply, fill #0

## 2022-08-14 ENCOUNTER — Other Ambulatory Visit (HOSPITAL_COMMUNITY): Payer: Self-pay

## 2022-08-14 MED ORDER — FAMOTIDINE 40 MG PO TABS
40.0000 mg | ORAL_TABLET | Freq: Every day | ORAL | 1 refills | Status: DC
  Filled 2022-08-14: qty 90, 90d supply, fill #0
  Filled 2022-11-18: qty 90, 90d supply, fill #1

## 2022-08-29 ENCOUNTER — Other Ambulatory Visit (HOSPITAL_COMMUNITY): Payer: Self-pay

## 2022-09-12 ENCOUNTER — Ambulatory Visit
Admission: RE | Admit: 2022-09-12 | Discharge: 2022-09-12 | Disposition: A | Discharge status: Home / Self Care | Payer: 59 | Source: Ambulatory Visit | Attending: Obstetrics and Gynecology | Admitting: Obstetrics and Gynecology

## 2022-09-12 DIAGNOSIS — N6320 Unspecified lump in the left breast, unspecified quadrant: Secondary | ICD-10-CM

## 2022-09-12 DIAGNOSIS — N6324 Unspecified lump in the left breast, lower inner quadrant: Secondary | ICD-10-CM | POA: Diagnosis not present

## 2022-09-12 DIAGNOSIS — R922 Inconclusive mammogram: Secondary | ICD-10-CM | POA: Diagnosis not present

## 2022-09-26 ENCOUNTER — Other Ambulatory Visit (HOSPITAL_COMMUNITY): Payer: Self-pay

## 2022-09-26 DIAGNOSIS — H5213 Myopia, bilateral: Secondary | ICD-10-CM | POA: Diagnosis not present

## 2022-10-08 ENCOUNTER — Other Ambulatory Visit (HOSPITAL_COMMUNITY): Payer: Self-pay

## 2022-10-09 ENCOUNTER — Other Ambulatory Visit (HOSPITAL_COMMUNITY): Payer: Self-pay

## 2022-10-09 MED ORDER — AMLODIPINE BESYLATE 10 MG PO TABS
10.0000 mg | ORAL_TABLET | Freq: Every day | ORAL | 3 refills | Status: DC
  Filled 2022-10-09: qty 90, 90d supply, fill #0
  Filled 2023-01-06: qty 90, 90d supply, fill #1
  Filled 2023-04-04: qty 90, 90d supply, fill #2
  Filled 2023-07-20: qty 90, 90d supply, fill #3

## 2022-10-10 ENCOUNTER — Other Ambulatory Visit (HOSPITAL_COMMUNITY): Payer: Self-pay

## 2022-10-10 MED ORDER — ROSUVASTATIN CALCIUM 5 MG PO TABS
5.0000 mg | ORAL_TABLET | Freq: Every day | ORAL | 3 refills | Status: AC
  Filled 2022-10-10 (×2): qty 90, 90d supply, fill #0
  Filled 2023-01-16: qty 90, 90d supply, fill #1
  Filled 2023-05-06: qty 90, 90d supply, fill #2

## 2022-10-20 ENCOUNTER — Other Ambulatory Visit (HOSPITAL_COMMUNITY): Payer: Self-pay

## 2022-10-31 ENCOUNTER — Other Ambulatory Visit (HOSPITAL_COMMUNITY): Payer: Self-pay

## 2022-11-09 ENCOUNTER — Other Ambulatory Visit (HOSPITAL_COMMUNITY): Payer: Self-pay

## 2022-11-10 ENCOUNTER — Other Ambulatory Visit (HOSPITAL_COMMUNITY): Payer: Self-pay

## 2022-11-10 MED ORDER — LOSARTAN POTASSIUM 50 MG PO TABS
ORAL_TABLET | ORAL | 0 refills | Status: DC
  Filled 2022-11-10 – 2022-11-11 (×2): qty 45, 90d supply, fill #0

## 2022-11-11 ENCOUNTER — Other Ambulatory Visit (HOSPITAL_COMMUNITY): Payer: Self-pay

## 2022-11-18 ENCOUNTER — Other Ambulatory Visit (HOSPITAL_COMMUNITY): Payer: Self-pay

## 2022-12-01 ENCOUNTER — Other Ambulatory Visit: Payer: Self-pay

## 2023-01-06 ENCOUNTER — Other Ambulatory Visit (HOSPITAL_COMMUNITY): Payer: Self-pay

## 2023-01-07 ENCOUNTER — Other Ambulatory Visit: Payer: Self-pay

## 2023-01-08 ENCOUNTER — Other Ambulatory Visit (HOSPITAL_COMMUNITY): Payer: Self-pay

## 2023-01-08 MED ORDER — METFORMIN HCL ER 500 MG PO TB24
500.0000 mg | ORAL_TABLET | Freq: Two times a day (BID) | ORAL | 1 refills | Status: DC
  Filled 2023-01-08: qty 180, 90d supply, fill #0
  Filled 2023-04-04: qty 180, 90d supply, fill #1

## 2023-01-13 ENCOUNTER — Other Ambulatory Visit: Payer: Self-pay

## 2023-01-17 ENCOUNTER — Other Ambulatory Visit (HOSPITAL_COMMUNITY): Payer: Self-pay

## 2023-02-03 ENCOUNTER — Other Ambulatory Visit (HOSPITAL_COMMUNITY): Payer: Self-pay

## 2023-02-03 MED ORDER — LOSARTAN POTASSIUM 50 MG PO TABS
25.0000 mg | ORAL_TABLET | Freq: Every day | ORAL | 1 refills | Status: DC
  Filled 2023-02-03: qty 45, 90d supply, fill #0
  Filled 2023-05-14: qty 45, 90d supply, fill #1

## 2023-02-04 ENCOUNTER — Other Ambulatory Visit (HOSPITAL_COMMUNITY): Payer: Self-pay

## 2023-02-16 ENCOUNTER — Other Ambulatory Visit: Payer: Self-pay

## 2023-02-16 ENCOUNTER — Other Ambulatory Visit (HOSPITAL_COMMUNITY): Payer: Self-pay

## 2023-02-16 MED ORDER — FAMOTIDINE 40 MG PO TABS
40.0000 mg | ORAL_TABLET | Freq: Every day | ORAL | 1 refills | Status: DC
  Filled 2023-02-16: qty 90, 90d supply, fill #0
  Filled 2023-05-14: qty 90, 90d supply, fill #1

## 2023-04-06 ENCOUNTER — Other Ambulatory Visit: Payer: Self-pay

## 2023-05-06 ENCOUNTER — Other Ambulatory Visit (HOSPITAL_COMMUNITY): Payer: Self-pay

## 2023-05-14 ENCOUNTER — Other Ambulatory Visit (HOSPITAL_COMMUNITY): Payer: Self-pay

## 2023-07-06 ENCOUNTER — Other Ambulatory Visit: Payer: Self-pay

## 2023-07-20 ENCOUNTER — Other Ambulatory Visit (HOSPITAL_COMMUNITY): Payer: Self-pay

## 2023-07-20 MED ORDER — METFORMIN HCL ER 500 MG PO TB24
500.0000 mg | ORAL_TABLET | Freq: Two times a day (BID) | ORAL | 0 refills | Status: DC
  Filled 2023-07-20: qty 180, 90d supply, fill #0

## 2023-08-10 ENCOUNTER — Other Ambulatory Visit: Payer: Self-pay

## 2023-08-10 ENCOUNTER — Other Ambulatory Visit (HOSPITAL_COMMUNITY): Payer: Self-pay

## 2023-08-10 DIAGNOSIS — Z0000 Encounter for general adult medical examination without abnormal findings: Secondary | ICD-10-CM | POA: Diagnosis not present

## 2023-08-10 DIAGNOSIS — I10 Essential (primary) hypertension: Secondary | ICD-10-CM | POA: Diagnosis not present

## 2023-08-10 DIAGNOSIS — E1169 Type 2 diabetes mellitus with other specified complication: Secondary | ICD-10-CM | POA: Diagnosis not present

## 2023-08-10 DIAGNOSIS — E7800 Pure hypercholesterolemia, unspecified: Secondary | ICD-10-CM | POA: Diagnosis not present

## 2023-08-10 DIAGNOSIS — Z6833 Body mass index (BMI) 33.0-33.9, adult: Secondary | ICD-10-CM | POA: Diagnosis not present

## 2023-08-10 DIAGNOSIS — F5104 Psychophysiologic insomnia: Secondary | ICD-10-CM | POA: Diagnosis not present

## 2023-08-10 MED ORDER — ROSUVASTATIN CALCIUM 5 MG PO TABS
5.0000 mg | ORAL_TABLET | Freq: Every day | ORAL | 1 refills | Status: DC
  Filled 2023-08-10: qty 90, 90d supply, fill #0
  Filled 2023-11-18: qty 90, 90d supply, fill #1

## 2023-08-10 MED ORDER — LOSARTAN POTASSIUM 50 MG PO TABS
25.0000 mg | ORAL_TABLET | Freq: Every day | ORAL | 1 refills | Status: DC
  Filled 2023-08-10: qty 45, 90d supply, fill #0
  Filled 2023-11-18: qty 45, 90d supply, fill #1

## 2023-08-10 MED ORDER — TRAZODONE HCL 100 MG PO TABS
150.0000 mg | ORAL_TABLET | Freq: Every day | ORAL | 1 refills | Status: AC | PRN
  Filled 2023-08-10 – 2023-12-03 (×2): qty 135, 90d supply, fill #0

## 2023-08-10 MED ORDER — AMLODIPINE BESYLATE 10 MG PO TABS
10.0000 mg | ORAL_TABLET | Freq: Every day | ORAL | 3 refills | Status: DC
  Filled 2023-08-10 – 2023-10-27 (×2): qty 90, 90d supply, fill #0
  Filled 2024-01-21: qty 90, 90d supply, fill #1
  Filled 2024-04-29: qty 90, 90d supply, fill #2

## 2023-08-10 MED ORDER — OZEMPIC (0.25 OR 0.5 MG/DOSE) 2 MG/3ML ~~LOC~~ SOPN
0.2500 mg | PEN_INJECTOR | SUBCUTANEOUS | 1 refills | Status: DC
  Filled 2023-08-10 – 2023-08-14 (×4): qty 3, 28d supply, fill #0
  Filled 2023-09-09: qty 3, 28d supply, fill #1

## 2023-08-11 ENCOUNTER — Other Ambulatory Visit: Payer: Self-pay

## 2023-08-11 ENCOUNTER — Other Ambulatory Visit (HOSPITAL_COMMUNITY): Payer: Self-pay

## 2023-08-14 ENCOUNTER — Other Ambulatory Visit (HOSPITAL_COMMUNITY): Payer: Self-pay

## 2023-08-14 ENCOUNTER — Other Ambulatory Visit: Payer: Self-pay

## 2023-08-29 ENCOUNTER — Other Ambulatory Visit (HOSPITAL_COMMUNITY): Payer: Self-pay

## 2023-09-01 ENCOUNTER — Other Ambulatory Visit: Payer: Self-pay

## 2023-09-01 ENCOUNTER — Other Ambulatory Visit (HOSPITAL_COMMUNITY): Payer: Self-pay

## 2023-09-01 MED ORDER — FAMOTIDINE 40 MG PO TABS
40.0000 mg | ORAL_TABLET | Freq: Every day | ORAL | 3 refills | Status: DC
  Filled 2023-09-01: qty 90, 90d supply, fill #0
  Filled 2023-12-12: qty 90, 90d supply, fill #1
  Filled 2024-03-11: qty 90, 90d supply, fill #2
  Filled 2024-06-14: qty 90, 90d supply, fill #3

## 2023-09-09 ENCOUNTER — Other Ambulatory Visit (HOSPITAL_COMMUNITY): Payer: Self-pay

## 2023-10-08 ENCOUNTER — Other Ambulatory Visit (HOSPITAL_COMMUNITY): Payer: Self-pay

## 2023-10-09 ENCOUNTER — Other Ambulatory Visit (HOSPITAL_COMMUNITY): Payer: Self-pay

## 2023-10-09 MED ORDER — OZEMPIC (1 MG/DOSE) 4 MG/3ML ~~LOC~~ SOPN
1.0000 mg | PEN_INJECTOR | SUBCUTANEOUS | 0 refills | Status: DC
  Filled 2023-10-09: qty 3, 28d supply, fill #0

## 2023-10-23 ENCOUNTER — Other Ambulatory Visit (HOSPITAL_COMMUNITY): Payer: Self-pay

## 2023-10-23 MED ORDER — ONDANSETRON HCL 4 MG PO TABS
4.0000 mg | ORAL_TABLET | Freq: Four times a day (QID) | ORAL | 0 refills | Status: AC | PRN
  Filled 2023-10-23: qty 30, 8d supply, fill #0

## 2023-10-27 ENCOUNTER — Other Ambulatory Visit: Payer: Self-pay

## 2023-11-04 ENCOUNTER — Other Ambulatory Visit (HOSPITAL_COMMUNITY): Payer: Self-pay

## 2023-11-04 ENCOUNTER — Other Ambulatory Visit: Payer: Self-pay

## 2023-11-04 MED ORDER — OZEMPIC (2 MG/DOSE) 8 MG/3ML ~~LOC~~ SOPN
2.0000 mg | PEN_INJECTOR | SUBCUTANEOUS | 0 refills | Status: DC
  Filled 2023-11-04: qty 3, 28d supply, fill #0

## 2023-11-05 ENCOUNTER — Other Ambulatory Visit (HOSPITAL_COMMUNITY): Payer: Self-pay

## 2023-11-09 ENCOUNTER — Other Ambulatory Visit (HOSPITAL_COMMUNITY): Payer: Self-pay

## 2023-11-10 ENCOUNTER — Other Ambulatory Visit: Payer: Self-pay

## 2023-11-10 ENCOUNTER — Other Ambulatory Visit (HOSPITAL_COMMUNITY): Payer: Self-pay

## 2023-11-18 ENCOUNTER — Other Ambulatory Visit (HOSPITAL_COMMUNITY): Payer: Self-pay

## 2023-12-01 ENCOUNTER — Other Ambulatory Visit (HOSPITAL_COMMUNITY): Payer: Self-pay

## 2023-12-01 MED ORDER — OZEMPIC (2 MG/DOSE) 8 MG/3ML ~~LOC~~ SOPN
2.0000 mg | PEN_INJECTOR | SUBCUTANEOUS | 0 refills | Status: DC
  Filled 2023-12-01 – 2023-12-03 (×2): qty 3, 28d supply, fill #0

## 2023-12-03 ENCOUNTER — Other Ambulatory Visit (HOSPITAL_COMMUNITY): Payer: Self-pay

## 2023-12-03 ENCOUNTER — Other Ambulatory Visit: Payer: Self-pay

## 2023-12-14 ENCOUNTER — Other Ambulatory Visit (HOSPITAL_COMMUNITY): Payer: Self-pay

## 2023-12-22 ENCOUNTER — Other Ambulatory Visit (HOSPITAL_COMMUNITY): Payer: Self-pay

## 2023-12-22 MED ORDER — METFORMIN HCL ER 500 MG PO TB24
500.0000 mg | ORAL_TABLET | Freq: Every day | ORAL | 1 refills | Status: DC
  Filled 2023-12-22: qty 90, 90d supply, fill #0
  Filled 2024-03-22: qty 90, 90d supply, fill #1

## 2023-12-29 ENCOUNTER — Other Ambulatory Visit (HOSPITAL_COMMUNITY): Payer: Self-pay

## 2023-12-29 ENCOUNTER — Other Ambulatory Visit: Payer: Self-pay

## 2023-12-29 MED ORDER — OZEMPIC (2 MG/DOSE) 8 MG/3ML ~~LOC~~ SOPN
2.0000 mg | PEN_INJECTOR | SUBCUTANEOUS | 0 refills | Status: AC
  Filled 2023-12-29: qty 3, 28d supply, fill #0

## 2024-01-12 ENCOUNTER — Other Ambulatory Visit: Payer: Self-pay | Admitting: Family Medicine

## 2024-01-12 DIAGNOSIS — Z1231 Encounter for screening mammogram for malignant neoplasm of breast: Secondary | ICD-10-CM

## 2024-01-13 ENCOUNTER — Other Ambulatory Visit (HOSPITAL_COMMUNITY): Payer: Self-pay

## 2024-01-13 DIAGNOSIS — E7800 Pure hypercholesterolemia, unspecified: Secondary | ICD-10-CM | POA: Diagnosis not present

## 2024-01-13 DIAGNOSIS — Z6831 Body mass index (BMI) 31.0-31.9, adult: Secondary | ICD-10-CM | POA: Diagnosis not present

## 2024-01-13 DIAGNOSIS — E1169 Type 2 diabetes mellitus with other specified complication: Secondary | ICD-10-CM | POA: Diagnosis not present

## 2024-01-13 DIAGNOSIS — F5104 Psychophysiologic insomnia: Secondary | ICD-10-CM | POA: Diagnosis not present

## 2024-01-13 DIAGNOSIS — I10 Essential (primary) hypertension: Secondary | ICD-10-CM | POA: Diagnosis not present

## 2024-01-13 MED ORDER — TRAZODONE HCL 100 MG PO TABS
150.0000 mg | ORAL_TABLET | Freq: Every day | ORAL | 1 refills | Status: DC | PRN
Start: 2024-01-13 — End: 2024-08-30
  Filled 2024-01-13 – 2024-03-11 (×2): qty 135, 90d supply, fill #0
  Filled 2024-07-01: qty 135, 90d supply, fill #1

## 2024-01-13 MED ORDER — TIRZEPATIDE-WEIGHT MANAGEMENT 7.5 MG/0.5ML ~~LOC~~ SOAJ
7.5000 mg | SUBCUTANEOUS | 1 refills | Status: AC
  Filled 2024-01-13 – 2024-01-24 (×3): qty 2, 28d supply, fill #0

## 2024-01-14 ENCOUNTER — Other Ambulatory Visit: Payer: Self-pay

## 2024-01-14 ENCOUNTER — Other Ambulatory Visit (HOSPITAL_COMMUNITY): Payer: Self-pay

## 2024-01-19 ENCOUNTER — Ambulatory Visit
Admission: RE | Admit: 2024-01-19 | Discharge: 2024-01-19 | Disposition: A | Discharge status: Home / Self Care | Source: Ambulatory Visit | Attending: Family Medicine | Admitting: Family Medicine

## 2024-01-19 DIAGNOSIS — Z1231 Encounter for screening mammogram for malignant neoplasm of breast: Secondary | ICD-10-CM | POA: Diagnosis not present

## 2024-01-20 ENCOUNTER — Other Ambulatory Visit (HOSPITAL_COMMUNITY): Payer: Self-pay

## 2024-01-21 ENCOUNTER — Other Ambulatory Visit (HOSPITAL_COMMUNITY): Payer: Self-pay

## 2024-01-22 ENCOUNTER — Other Ambulatory Visit (HOSPITAL_COMMUNITY): Payer: Self-pay

## 2024-01-22 ENCOUNTER — Other Ambulatory Visit: Payer: Self-pay

## 2024-01-23 ENCOUNTER — Other Ambulatory Visit (HOSPITAL_COMMUNITY): Payer: Self-pay

## 2024-01-25 ENCOUNTER — Other Ambulatory Visit (HOSPITAL_COMMUNITY): Payer: Self-pay

## 2024-01-25 ENCOUNTER — Other Ambulatory Visit: Payer: Self-pay

## 2024-01-26 ENCOUNTER — Other Ambulatory Visit (HOSPITAL_COMMUNITY): Payer: Self-pay

## 2024-01-26 MED ORDER — MOUNJARO 5 MG/0.5ML ~~LOC~~ SOAJ
5.0000 mg | SUBCUTANEOUS | 0 refills | Status: AC
Start: 2024-01-26 — End: ?
  Filled 2024-01-26: qty 2, 28d supply, fill #0

## 2024-01-27 ENCOUNTER — Other Ambulatory Visit (HOSPITAL_COMMUNITY): Payer: Self-pay

## 2024-02-05 ENCOUNTER — Other Ambulatory Visit (HOSPITAL_COMMUNITY): Payer: Self-pay

## 2024-02-11 ENCOUNTER — Other Ambulatory Visit (HOSPITAL_COMMUNITY): Payer: Self-pay

## 2024-02-22 ENCOUNTER — Other Ambulatory Visit (HOSPITAL_COMMUNITY): Payer: Self-pay

## 2024-02-23 ENCOUNTER — Other Ambulatory Visit: Payer: Self-pay

## 2024-02-23 ENCOUNTER — Other Ambulatory Visit (HOSPITAL_COMMUNITY): Payer: Self-pay

## 2024-02-23 MED ORDER — ROSUVASTATIN CALCIUM 5 MG PO TABS
5.0000 mg | ORAL_TABLET | Freq: Every day | ORAL | 1 refills | Status: AC
  Filled 2024-02-23: qty 90, 90d supply, fill #0
  Filled 2024-05-22: qty 90, 90d supply, fill #1

## 2024-02-23 MED ORDER — LOSARTAN POTASSIUM 50 MG PO TABS
25.0000 mg | ORAL_TABLET | Freq: Every day | ORAL | 0 refills | Status: DC
  Filled 2024-02-23: qty 45, 90d supply, fill #0

## 2024-02-23 MED ORDER — MOUNJARO 7.5 MG/0.5ML ~~LOC~~ SOAJ
7.5000 mg | SUBCUTANEOUS | 0 refills | Status: AC
  Filled 2024-02-23: qty 2, 28d supply, fill #0

## 2024-03-11 ENCOUNTER — Other Ambulatory Visit (HOSPITAL_COMMUNITY): Payer: Self-pay

## 2024-03-22 ENCOUNTER — Other Ambulatory Visit (HOSPITAL_COMMUNITY): Payer: Self-pay

## 2024-03-22 ENCOUNTER — Other Ambulatory Visit: Payer: Self-pay

## 2024-03-22 MED ORDER — MOUNJARO 10 MG/0.5ML ~~LOC~~ SOAJ
10.0000 mg | SUBCUTANEOUS | 1 refills | Status: AC
  Filled 2024-03-22 (×2): qty 2, 28d supply, fill #0

## 2024-04-14 ENCOUNTER — Other Ambulatory Visit: Payer: Self-pay

## 2024-04-14 ENCOUNTER — Other Ambulatory Visit (HOSPITAL_COMMUNITY): Payer: Self-pay

## 2024-04-14 DIAGNOSIS — E1169 Type 2 diabetes mellitus with other specified complication: Secondary | ICD-10-CM | POA: Diagnosis not present

## 2024-04-14 DIAGNOSIS — Z6830 Body mass index (BMI) 30.0-30.9, adult: Secondary | ICD-10-CM | POA: Diagnosis not present

## 2024-04-14 MED ORDER — MOUNJARO 12.5 MG/0.5ML ~~LOC~~ SOAJ
12.5000 mg | SUBCUTANEOUS | 1 refills | Status: AC
  Filled 2024-04-14: qty 2, 28d supply, fill #0

## 2024-04-29 ENCOUNTER — Other Ambulatory Visit (HOSPITAL_COMMUNITY): Payer: Self-pay

## 2024-05-10 ENCOUNTER — Other Ambulatory Visit (HOSPITAL_COMMUNITY): Payer: Self-pay

## 2024-05-10 ENCOUNTER — Other Ambulatory Visit: Payer: Self-pay

## 2024-05-10 MED ORDER — MOUNJARO 15 MG/0.5ML ~~LOC~~ SOAJ
0.5000 mL | SUBCUTANEOUS | 2 refills | Status: DC
  Filled 2024-05-10: qty 2, 28d supply, fill #0
  Filled 2024-06-14: qty 2, 28d supply, fill #1
  Filled 2024-07-12: qty 2, 28d supply, fill #2

## 2024-05-16 ENCOUNTER — Other Ambulatory Visit (HOSPITAL_COMMUNITY): Payer: Self-pay

## 2024-05-17 ENCOUNTER — Other Ambulatory Visit: Payer: Self-pay

## 2024-05-17 ENCOUNTER — Other Ambulatory Visit (HOSPITAL_COMMUNITY): Payer: Self-pay

## 2024-05-17 MED ORDER — LOSARTAN POTASSIUM 50 MG PO TABS
25.0000 mg | ORAL_TABLET | Freq: Every day | ORAL | 0 refills | Status: AC
  Filled 2024-05-17: qty 45, 90d supply, fill #0

## 2024-05-23 ENCOUNTER — Other Ambulatory Visit (HOSPITAL_COMMUNITY): Payer: Self-pay

## 2024-06-14 ENCOUNTER — Other Ambulatory Visit (HOSPITAL_COMMUNITY): Payer: Self-pay

## 2024-07-01 ENCOUNTER — Other Ambulatory Visit (HOSPITAL_COMMUNITY): Payer: Self-pay

## 2024-07-04 ENCOUNTER — Other Ambulatory Visit (HOSPITAL_COMMUNITY): Payer: Self-pay

## 2024-07-05 ENCOUNTER — Other Ambulatory Visit (HOSPITAL_COMMUNITY): Payer: Self-pay

## 2024-07-05 ENCOUNTER — Other Ambulatory Visit: Payer: Self-pay

## 2024-07-05 MED ORDER — METFORMIN HCL ER 500 MG PO TB24
500.0000 mg | ORAL_TABLET | Freq: Every evening | ORAL | 1 refills | Status: AC
  Filled 2024-07-05: qty 90, 90d supply, fill #0

## 2024-07-12 ENCOUNTER — Other Ambulatory Visit (HOSPITAL_COMMUNITY): Payer: Self-pay

## 2024-08-04 ENCOUNTER — Other Ambulatory Visit (HOSPITAL_COMMUNITY): Payer: Self-pay

## 2024-08-04 MED ORDER — FLUZONE 0.5 ML IM SUSY
0.5000 mL | PREFILLED_SYRINGE | Freq: Once | INTRAMUSCULAR | 0 refills | Status: AC
  Filled 2024-08-04: qty 0.5, 1d supply, fill #0

## 2024-08-09 ENCOUNTER — Other Ambulatory Visit (HOSPITAL_COMMUNITY): Payer: Self-pay

## 2024-08-09 ENCOUNTER — Other Ambulatory Visit: Payer: Self-pay

## 2024-08-09 MED ORDER — MOUNJARO 15 MG/0.5ML ~~LOC~~ SOAJ
15.0000 mg | SUBCUTANEOUS | 0 refills | Status: DC
  Filled 2024-08-09: qty 6, 84d supply, fill #0

## 2024-08-30 ENCOUNTER — Other Ambulatory Visit: Payer: Self-pay

## 2024-08-30 ENCOUNTER — Other Ambulatory Visit (HOSPITAL_COMMUNITY): Payer: Self-pay

## 2024-08-30 DIAGNOSIS — E119 Type 2 diabetes mellitus without complications: Secondary | ICD-10-CM | POA: Diagnosis not present

## 2024-08-30 DIAGNOSIS — E7800 Pure hypercholesterolemia, unspecified: Secondary | ICD-10-CM | POA: Diagnosis not present

## 2024-08-30 DIAGNOSIS — G4700 Insomnia, unspecified: Secondary | ICD-10-CM | POA: Diagnosis not present

## 2024-08-30 DIAGNOSIS — Z6827 Body mass index (BMI) 27.0-27.9, adult: Secondary | ICD-10-CM | POA: Diagnosis not present

## 2024-08-30 DIAGNOSIS — K219 Gastro-esophageal reflux disease without esophagitis: Secondary | ICD-10-CM | POA: Diagnosis not present

## 2024-08-30 DIAGNOSIS — I10 Essential (primary) hypertension: Secondary | ICD-10-CM | POA: Diagnosis not present

## 2024-08-30 DIAGNOSIS — Z0000 Encounter for general adult medical examination without abnormal findings: Secondary | ICD-10-CM | POA: Diagnosis not present

## 2024-08-30 MED ORDER — MOUNJARO 15 MG/0.5ML ~~LOC~~ SOAJ
15.0000 mg | SUBCUTANEOUS | 0 refills | Status: AC
  Filled 2024-10-23: qty 6, 84d supply, fill #0

## 2024-08-30 MED ORDER — AMLODIPINE BESYLATE 2.5 MG PO TABS
2.5000 mg | ORAL_TABLET | Freq: Every day | ORAL | 1 refills | Status: AC
  Filled 2024-08-30: qty 90, 90d supply, fill #0

## 2024-08-30 MED ORDER — TRAZODONE HCL 100 MG PO TABS
200.0000 mg | ORAL_TABLET | Freq: Every evening | ORAL | 1 refills | Status: AC | PRN
  Filled 2024-08-30: qty 180, 90d supply, fill #0

## 2024-08-30 MED ORDER — ROSUVASTATIN CALCIUM 5 MG PO TABS
5.0000 mg | ORAL_TABLET | Freq: Every day | ORAL | 1 refills | Status: AC
  Filled 2024-08-30: qty 90, 90d supply, fill #0

## 2024-08-31 ENCOUNTER — Other Ambulatory Visit (HOSPITAL_COMMUNITY): Payer: Self-pay

## 2024-09-04 ENCOUNTER — Other Ambulatory Visit (HOSPITAL_COMMUNITY): Payer: Self-pay

## 2024-09-05 ENCOUNTER — Other Ambulatory Visit: Payer: Self-pay

## 2024-09-05 ENCOUNTER — Other Ambulatory Visit (HOSPITAL_COMMUNITY): Payer: Self-pay

## 2024-09-05 MED ORDER — LOSARTAN POTASSIUM 50 MG PO TABS
25.0000 mg | ORAL_TABLET | Freq: Every day | ORAL | 1 refills | Status: AC
  Filled 2024-09-05: qty 15, 30d supply, fill #0
  Filled 2024-09-25 – 2024-09-30 (×4): qty 15, 30d supply, fill #1
  Filled 2024-11-04: qty 45, 90d supply, fill #2

## 2024-09-06 ENCOUNTER — Other Ambulatory Visit (HOSPITAL_COMMUNITY): Payer: Self-pay

## 2024-09-09 ENCOUNTER — Other Ambulatory Visit (HOSPITAL_COMMUNITY): Payer: Self-pay

## 2024-09-12 ENCOUNTER — Other Ambulatory Visit (HOSPITAL_COMMUNITY): Payer: Self-pay

## 2024-09-12 ENCOUNTER — Other Ambulatory Visit: Payer: Self-pay

## 2024-09-12 MED ORDER — FAMOTIDINE 40 MG PO TABS
40.0000 mg | ORAL_TABLET | Freq: Every day | ORAL | 1 refills | Status: AC
  Filled 2024-09-12: qty 90, 90d supply, fill #0

## 2024-09-15 DIAGNOSIS — Z1212 Encounter for screening for malignant neoplasm of rectum: Secondary | ICD-10-CM | POA: Diagnosis not present

## 2024-09-15 DIAGNOSIS — Z1211 Encounter for screening for malignant neoplasm of colon: Secondary | ICD-10-CM | POA: Diagnosis not present

## 2024-09-17 DIAGNOSIS — H5213 Myopia, bilateral: Secondary | ICD-10-CM | POA: Diagnosis not present

## 2024-09-17 DIAGNOSIS — E119 Type 2 diabetes mellitus without complications: Secondary | ICD-10-CM | POA: Diagnosis not present

## 2024-09-26 ENCOUNTER — Other Ambulatory Visit (HOSPITAL_COMMUNITY): Payer: Self-pay

## 2024-09-27 ENCOUNTER — Other Ambulatory Visit (HOSPITAL_COMMUNITY): Payer: Self-pay

## 2024-10-24 ENCOUNTER — Other Ambulatory Visit: Payer: Self-pay

## 2024-10-24 ENCOUNTER — Other Ambulatory Visit (HOSPITAL_COMMUNITY): Payer: Self-pay

## 2024-11-04 ENCOUNTER — Other Ambulatory Visit: Payer: Self-pay
